# Patient Record
Sex: Female | Born: 1970
Health system: Southern US, Community
[De-identification: ages and names within clinical notes are randomized; demographics above are authoritative.]

## PROBLEM LIST (undated history)

## (undated) DIAGNOSIS — T7840XA Allergy, unspecified, initial encounter: Secondary | ICD-10-CM

## (undated) DIAGNOSIS — L659 Nonscarring hair loss, unspecified: Secondary | ICD-10-CM

## (undated) DIAGNOSIS — Z8041 Family history of malignant neoplasm of ovary: Secondary | ICD-10-CM

## (undated) DIAGNOSIS — N941 Unspecified dyspareunia: Secondary | ICD-10-CM

## (undated) DIAGNOSIS — K219 Gastro-esophageal reflux disease without esophagitis: Secondary | ICD-10-CM

## (undated) DIAGNOSIS — E559 Vitamin D deficiency, unspecified: Secondary | ICD-10-CM

## (undated) DIAGNOSIS — G63 Polyneuropathy in diseases classified elsewhere: Secondary | ICD-10-CM

## (undated) DIAGNOSIS — N200 Calculus of kidney: Secondary | ICD-10-CM

## (undated) DIAGNOSIS — R011 Cardiac murmur, unspecified: Secondary | ICD-10-CM

## (undated) DIAGNOSIS — F419 Anxiety disorder, unspecified: Secondary | ICD-10-CM

## (undated) DIAGNOSIS — Z87442 Personal history of urinary calculi: Secondary | ICD-10-CM

## (undated) DIAGNOSIS — J309 Allergic rhinitis, unspecified: Secondary | ICD-10-CM

## (undated) DIAGNOSIS — G5600 Carpal tunnel syndrome, unspecified upper limb: Secondary | ICD-10-CM

## (undated) DIAGNOSIS — K589 Irritable bowel syndrome without diarrhea: Secondary | ICD-10-CM

## (undated) DIAGNOSIS — F32A Depression, unspecified: Secondary | ICD-10-CM

## (undated) DIAGNOSIS — M1990 Unspecified osteoarthritis, unspecified site: Secondary | ICD-10-CM

## (undated) DIAGNOSIS — Z803 Family history of malignant neoplasm of breast: Secondary | ICD-10-CM

## (undated) DIAGNOSIS — E538 Deficiency of other specified B group vitamins: Secondary | ICD-10-CM

## (undated) DIAGNOSIS — F329 Major depressive disorder, single episode, unspecified: Secondary | ICD-10-CM

## (undated) DIAGNOSIS — G4730 Sleep apnea, unspecified: Secondary | ICD-10-CM

## (undated) DIAGNOSIS — L309 Dermatitis, unspecified: Secondary | ICD-10-CM

## (undated) HISTORY — DX: Vitamin D deficiency, unspecified: E55.9

## (undated) HISTORY — DX: Anxiety disorder, unspecified: F41.9

## (undated) HISTORY — DX: Allergy, unspecified, initial encounter: T78.40XA

## (undated) HISTORY — DX: Polyneuropathy in diseases classified elsewhere: E53.8

## (undated) HISTORY — DX: Irritable bowel syndrome, unspecified: K58.9

## (undated) HISTORY — DX: Unspecified dyspareunia: N94.10

## (undated) HISTORY — DX: Dermatitis, unspecified: L30.9

## (undated) HISTORY — DX: Calculus of kidney: N20.0

## (undated) HISTORY — DX: Family history of malignant neoplasm of ovary: Z80.41

## (undated) HISTORY — DX: Nonscarring hair loss, unspecified: L65.9

## (undated) HISTORY — DX: Major depressive disorder, single episode, unspecified: F32.9

## (undated) HISTORY — DX: Allergic rhinitis, unspecified: J30.9

## (undated) HISTORY — DX: Gastro-esophageal reflux disease without esophagitis: K21.9

## (undated) HISTORY — DX: Deficiency of other specified B group vitamins: G63

## (undated) HISTORY — DX: Unspecified osteoarthritis, unspecified site: M19.90

## (undated) HISTORY — DX: Carpal tunnel syndrome, unspecified upper limb: G56.00

## (undated) HISTORY — DX: Cardiac murmur, unspecified: R01.1

## (undated) HISTORY — DX: Depression, unspecified: F32.A

## (undated) HISTORY — DX: Family history of malignant neoplasm of breast: Z80.3

## (undated) SURGICAL SUPPLY — 2 items
GLOVE SURG SYN 6.5 ES PF (GLOVE) ×2 IMPLANT
GLOVE SURG SYN 8.5 E (GLOVE) ×6 IMPLANT

---

## 2005-11-24 HISTORY — PX: BARTHOLIN GLAND CYST EXCISION: SHX565

## 2006-10-24 HISTORY — PX: CHOLECYSTECTOMY: SHX55

## 2009-09-11 DIAGNOSIS — K589 Irritable bowel syndrome without diarrhea: Secondary | ICD-10-CM | POA: Insufficient documentation

## 2009-09-11 DIAGNOSIS — G4486 Cervicogenic headache: Secondary | ICD-10-CM | POA: Insufficient documentation

## 2009-09-11 DIAGNOSIS — J309 Allergic rhinitis, unspecified: Secondary | ICD-10-CM | POA: Insufficient documentation

## 2009-10-09 ENCOUNTER — Ambulatory Visit: Payer: Self-pay | Admitting: Family Medicine

## 2009-11-26 ENCOUNTER — Ambulatory Visit: Payer: Self-pay | Admitting: Unknown Physician Specialty

## 2009-12-06 ENCOUNTER — Encounter: Payer: Self-pay | Admitting: Maternal and Fetal Medicine

## 2010-06-27 ENCOUNTER — Observation Stay: Payer: Self-pay | Admitting: Obstetrics & Gynecology

## 2010-10-26 ENCOUNTER — Inpatient Hospital Stay: Payer: Self-pay | Admitting: Obstetrics and Gynecology

## 2010-10-27 DIAGNOSIS — O321XX0 Maternal care for breech presentation, not applicable or unspecified: Secondary | ICD-10-CM

## 2010-10-27 DIAGNOSIS — Z8759 Personal history of other complications of pregnancy, childbirth and the puerperium: Secondary | ICD-10-CM

## 2010-11-24 HISTORY — PX: COLONOSCOPY: SHX174

## 2011-05-02 ENCOUNTER — Ambulatory Visit: Payer: Self-pay | Admitting: Internal Medicine

## 2012-03-02 ENCOUNTER — Ambulatory Visit: Payer: Self-pay

## 2012-03-09 ENCOUNTER — Encounter: Payer: Self-pay | Admitting: Rheumatology

## 2012-03-24 ENCOUNTER — Encounter: Payer: Self-pay | Admitting: Rheumatology

## 2012-12-14 ENCOUNTER — Ambulatory Visit: Payer: Self-pay | Admitting: Otolaryngology

## 2014-03-01 DIAGNOSIS — F329 Major depressive disorder, single episode, unspecified: Secondary | ICD-10-CM | POA: Insufficient documentation

## 2014-03-01 DIAGNOSIS — M4690 Unspecified inflammatory spondylopathy, site unspecified: Secondary | ICD-10-CM | POA: Insufficient documentation

## 2014-03-01 DIAGNOSIS — M539 Dorsopathy, unspecified: Secondary | ICD-10-CM | POA: Insufficient documentation

## 2014-03-01 DIAGNOSIS — F32A Depression, unspecified: Secondary | ICD-10-CM | POA: Insufficient documentation

## 2015-09-11 ENCOUNTER — Ambulatory Visit (INDEPENDENT_AMBULATORY_CARE_PROVIDER_SITE_OTHER): Payer: BC Managed Care – PPO | Admitting: Family Medicine

## 2015-09-11 ENCOUNTER — Encounter: Payer: Self-pay | Admitting: Family Medicine

## 2015-09-11 VITALS — BP 126/86 | HR 114 | Temp 98.5°F | Resp 16 | Ht 66.0 in | Wt 190.8 lb

## 2015-09-11 DIAGNOSIS — F411 Generalized anxiety disorder: Secondary | ICD-10-CM | POA: Insufficient documentation

## 2015-09-11 DIAGNOSIS — L659 Nonscarring hair loss, unspecified: Secondary | ICD-10-CM | POA: Insufficient documentation

## 2015-09-11 DIAGNOSIS — L309 Dermatitis, unspecified: Secondary | ICD-10-CM | POA: Insufficient documentation

## 2015-09-11 DIAGNOSIS — G44219 Episodic tension-type headache, not intractable: Secondary | ICD-10-CM | POA: Diagnosis not present

## 2015-09-11 DIAGNOSIS — K219 Gastro-esophageal reflux disease without esophagitis: Secondary | ICD-10-CM | POA: Insufficient documentation

## 2015-09-11 DIAGNOSIS — F419 Anxiety disorder, unspecified: Secondary | ICD-10-CM | POA: Insufficient documentation

## 2015-09-11 DIAGNOSIS — G5600 Carpal tunnel syndrome, unspecified upper limb: Secondary | ICD-10-CM | POA: Insufficient documentation

## 2015-09-11 DIAGNOSIS — M069 Rheumatoid arthritis, unspecified: Secondary | ICD-10-CM | POA: Insufficient documentation

## 2015-09-11 DIAGNOSIS — E538 Deficiency of other specified B group vitamins: Secondary | ICD-10-CM | POA: Insufficient documentation

## 2015-09-11 DIAGNOSIS — F32A Depression, unspecified: Secondary | ICD-10-CM | POA: Insufficient documentation

## 2015-09-11 DIAGNOSIS — Z800 Family history of malignant neoplasm of digestive organs: Secondary | ICD-10-CM | POA: Insufficient documentation

## 2015-09-11 DIAGNOSIS — Z8349 Family history of other endocrine, nutritional and metabolic diseases: Secondary | ICD-10-CM | POA: Insufficient documentation

## 2015-09-11 DIAGNOSIS — G63 Polyneuropathy in diseases classified elsewhere: Secondary | ICD-10-CM

## 2015-09-11 DIAGNOSIS — E559 Vitamin D deficiency, unspecified: Secondary | ICD-10-CM | POA: Insufficient documentation

## 2015-09-11 DIAGNOSIS — F329 Major depressive disorder, single episode, unspecified: Secondary | ICD-10-CM | POA: Insufficient documentation

## 2015-09-11 MED ORDER — ISOMETHEPTENE-DICHLORAL-APAP 65-100-325 MG PO CAPS: 1.0000 | ORAL_CAPSULE | Freq: Four times a day (QID) | ORAL | Status: DC | PRN

## 2015-09-11 NOTE — Progress Notes (Addendum)
Subjective:     Patient ID: Carolyn Hays, female   DOB: [birth year], 44 y.o.   MRN: [id number]  HPI  Chief Complaint  Patient presents with  . Headache    Patient comes in office today to address chronic headache that has been intermittent since March. Patient reports over the last 6 weeks headache has stayed persistant on a daily basis, she reports taking otc Ibuprofen for pain. Patient was seen at [facility] Sunday 10/16 with complaints of headache, physician diagnosed patient with labyrinthitis and was prescribed Meclizine 25mg  .   States pressure headaches were persistent for months until August when they abated to intermittent in nature. Start at back of head then generalize. Has had wisdom teeth removed, chiropractic treatment, and updated eye exam over the past year but did not relieve her headaches.Gets relief with hot and cold compresses and has used Midrin in the past successfully.   Review of Systems  Genitourinary:       Followed by [facility] ob-gyn  Musculoskeletal:       Rheumatoid Arthritis followed by Dr.Klett at [facility].  Skin:       Sees dermatologist, [facility]. [facility].  Neurological:       Remote hx of migraine       Objective:   Physical Exam  Constitutional: She appears well-developed and well-nourished. No distress.  HENT:  Right Ear: Tympanic membrane normal.  Left Ear: Tympanic membrane normal.  Eyes: EOM are normal. Pupils are equal, round, and reactive to light.  Musculoskeletal:  Cervical ROM WNL  Neurological: Coordination (finger to nose WNL) normal.       Assessment:    1. Episodic tension-type headache, not intractable: defers Toradol. - isometheptene-acetaminophen-dichloralphenazone (MIDRIN) 65-100-325 MG capsule; Take 1 capsule by mouth 4 (four) times daily as needed for migraine. Maximum 5 capsules in 12 hours for migraine headaches, 8 capsules in 24 hours for tension headaches.  Dispense: 30 capsule; Refill: 0    Plan:   Consider trial off Caryn Section if rheumatologist agrees. Consider neurology consultation. Not to take Midrin and meclizine together.

## 2015-09-11 NOTE — Patient Instructions (Addendum)
Discuss trial off Harriette Ohara with your rheumatologist. Consider neurology consultation.

## 2015-09-12 ENCOUNTER — Other Ambulatory Visit: Payer: Self-pay | Admitting: Family Medicine

## 2015-09-12 NOTE — Telephone Encounter (Signed)
Called Rx in to [facility] who did not have rx in stock. I informed patient of this and called in prescription to [facility] [place] as requested. KW

## 2015-09-12 NOTE — Telephone Encounter (Signed)
Pt stated that an RX for  isometheptene-acetaminophen-dichloralphenazone (MIDRIN) 65-100-325 MG capsule was supposed to be sent to [facility] in [place] but they had not received the RX. Can this be faxed or called in? Thanks TNP

## 2015-09-13 ENCOUNTER — Telehealth: Payer: Self-pay | Admitting: Family Medicine

## 2015-09-13 NOTE — Telephone Encounter (Signed)
Called pharm again and prescription is available for pick up left message for patient that Rx is ready. KW

## 2015-09-13 NOTE — Telephone Encounter (Signed)
Pt stated that her husband tried to pick up the RX isometheptene-acetaminophen-dichloralphenazone (MIDRIN) 65-100-325 MG capsule from [facility] [place] but was told it hadn't been called in. Pt requested someone [facility] and call it in again or check to see what happened. Thanks TNP

## 2015-10-26 HISTORY — PX: OTHER SURGICAL HISTORY: SHX169

## 2016-02-19 ENCOUNTER — Encounter: Payer: Self-pay | Admitting: Emergency Medicine

## 2016-07-10 ENCOUNTER — Ambulatory Visit: Payer: BC Managed Care – PPO | Admitting: Family Medicine

## 2016-07-17 ENCOUNTER — Encounter: Payer: Self-pay | Admitting: Family Medicine

## 2016-07-17 ENCOUNTER — Telehealth: Payer: Self-pay | Admitting: Family Medicine

## 2016-07-17 ENCOUNTER — Ambulatory Visit (INDEPENDENT_AMBULATORY_CARE_PROVIDER_SITE_OTHER): Payer: BC Managed Care – PPO | Admitting: Family Medicine

## 2016-07-17 VITALS — BP 110/70 | HR 96 | Temp 98.2°F | Resp 16 | Wt 202.2 lb

## 2016-07-17 DIAGNOSIS — Z87442 Personal history of urinary calculi: Secondary | ICD-10-CM

## 2016-07-17 DIAGNOSIS — R1011 Right upper quadrant pain: Secondary | ICD-10-CM | POA: Diagnosis not present

## 2016-07-17 NOTE — Telephone Encounter (Signed)
What is a peer to peer? Is there another visit required?

## 2016-07-17 NOTE — Patient Instructions (Addendum)
Continue to push fluid intake. We will call you with the time of the scan.

## 2016-07-17 NOTE — Progress Notes (Signed)
Subjective:     Patient ID: Carolyn Hays, female   DOB: 05-07-1971, 45 y.o.   MRN: [id number]  HPI  Chief Complaint  Patient presents with  . Chest Pain    Patient comes in office today with concerns of rib pain on her right side for the past 6 months. Patient denies injury or heavy exertion, patient describes pains as a sharp ache.   States she has passed kidney stones on her right side in the past but usually will feel them moving to her bladder. Reports her pain is intermittent and can last for a few minutes to an entire day. States when painful feels as intense as kidney stones. No specific triggers and not pleuritic. No change in her bowel habits. Has had prior cholecystectomy and colonoscopy in 2013 (polypectomy x one). Not in pain today.   Review of Systems     Objective:   Physical Exam  Constitutional: She appears well-developed and well-nourished. No distress.  Abdominal: Soft. There is tenderness (mild in right upper quadrant). There is no guarding.       Assessment:    1. Right upper quadrant pain - CT RENAL STONE STUDY; Future  2. History of kidney stones - CT RENAL STONE STUDY; Future    Plan:    If scan normal, consider repeat colonoscopy.

## 2016-07-17 NOTE — Telephone Encounter (Signed)
Insurance company would like a peer to peer to get CT approved.Phone [phone].Member # [id number]

## 2016-07-18 ENCOUNTER — Other Ambulatory Visit: Payer: Self-pay | Admitting: Family Medicine

## 2016-07-18 DIAGNOSIS — R1011 Right upper quadrant pain: Secondary | ICD-10-CM

## 2016-07-18 NOTE — Telephone Encounter (Signed)
Nadine Counts needs to cqll # listed below to talk with MD

## 2016-07-18 NOTE — Telephone Encounter (Signed)
Insurance will not approve a CT scan but suggested renal ultrasound first which will be ordered.

## 2016-07-23 ENCOUNTER — Ambulatory Visit
Admission: RE | Admit: 2016-07-23 | Discharge: 2016-07-23 | Disposition: A | Discharge status: Home / Self Care | Payer: BC Managed Care – PPO | Source: Ambulatory Visit | Attending: Family Medicine | Admitting: Family Medicine

## 2016-07-23 DIAGNOSIS — R1011 Right upper quadrant pain: Secondary | ICD-10-CM | POA: Insufficient documentation

## 2016-07-24 ENCOUNTER — Other Ambulatory Visit: Payer: Self-pay | Admitting: Family Medicine

## 2016-07-24 ENCOUNTER — Telehealth: Payer: Self-pay

## 2016-07-24 DIAGNOSIS — R1011 Right upper quadrant pain: Secondary | ICD-10-CM

## 2016-07-24 NOTE — Telephone Encounter (Signed)
Patient advised as directed below. Patient wants referral.  Thanks,  -Joseline

## 2016-07-24 NOTE — Telephone Encounter (Signed)
Referral in progress. 

## 2016-07-24 NOTE — Telephone Encounter (Signed)
-----   Message from Anola Gurney, [place] sent at 07/24/2016  7:42 AM EDT ----- Ultrasound is normal without evidence of stones. Would suggest you see a gastroenterologist. Do you wish [place] to make the referral?

## 2016-07-24 NOTE — Telephone Encounter (Signed)
LMTCB  Thanks,  -Joseline 

## 2016-09-02 ENCOUNTER — Ambulatory Visit: Payer: BC Managed Care – PPO | Admitting: Gastroenterology

## 2016-10-09 ENCOUNTER — Telehealth: Payer: Self-pay

## 2016-10-09 NOTE — Telephone Encounter (Signed)
Dr Sullivan Lone saw the results and all thyroid levels were fine except T3 was down by 1 point per Dr Sullivan Lone just to follow and no referral is needed at this time. Advised patient and advised patient if she would feel better proceeding with referral in case that is fine and just to let us know. Patient Carolyn Hays

## 2016-10-09 NOTE — Telephone Encounter (Signed)
Patients states that her rheumatologist ordered lab work and informed her that her thyroid function was abnormal and informed her that she needed to follow up with a doctor. Patient wanted your opinion if she should follow up with you to address or would you recommend her see a endocrinologist? Patient states that if you do recommend her to see specialist which one would you recommend her see?

## 2016-10-09 NOTE — Telephone Encounter (Signed)
Have not seen lab to be able to comment. Can advise recommendation  after I see lab work.

## 2016-11-07 ENCOUNTER — Ambulatory Visit (INDEPENDENT_AMBULATORY_CARE_PROVIDER_SITE_OTHER): Payer: BC Managed Care – PPO | Admitting: Family Medicine

## 2016-11-07 ENCOUNTER — Encounter: Payer: Self-pay | Admitting: Family Medicine

## 2016-11-07 VITALS — BP 106/74 | HR 100 | Temp 98.4°F | Resp 16 | Wt 202.6 lb

## 2016-11-07 DIAGNOSIS — I809 Phlebitis and thrombophlebitis of unspecified site: Secondary | ICD-10-CM | POA: Diagnosis not present

## 2016-11-07 DIAGNOSIS — J40 Bronchitis, not specified as acute or chronic: Secondary | ICD-10-CM | POA: Diagnosis not present

## 2016-11-07 MED ORDER — AZITHROMYCIN 250 MG PO TABS
ORAL_TABLET | ORAL | 0 refills | Status: DC
Start: 2016-11-07 — End: 2017-05-19

## 2016-11-07 NOTE — Progress Notes (Signed)
Subjective:     Patient ID: Carolyn Hays, female   DOB: 02-09-1971, 45 y.o.   MRN: [id number]  HPI  Chief Complaint  Patient presents with  . Cough    Patient comes in office today with symptoms of cough and congestion for the past 5 weeks, patient reports that she has taken otc cough drops.   . Leg Pain    Patient reports of calf pain on her right leg for the past 2 days. Patient describes pain as stinging and throbbing, she denies any swelling or injury.   States cough is non-productive. No sinus congestion. Reports both son and husband are ill as well. Has been taking cough drops with mild improvement. Remains on [place].   Review of Systems     Objective:   Physical Exam  Constitutional: She appears well-developed and well-nourished. No distress.  Cardiovascular:  Pulses:      Dorsalis pedis pulses are 2+ on the right side.       Posterior tibial pulses are 2+ on the right side.  Skin:  Right lateral calf with prominent varicose vein which is tender to the touch. No overlying erythema. No distal edema.  Ears: T.M's intact without inflammation Sinuses: non-tender Throat: mild tonsillar enlargement without erythema or exudate Neck: no cervical adenopathy Lungs: clear     Assessment:    1. Bronchitis - azithromycin (ZITHROMAX Z-PAK) 250 MG tablet; 2 pills the first day then one pill daily  Dispense: 6 each; Refill: 0  2. Phlebitis    Plan:    Discussed use of warm compresses and ASA. May use Delsym for cough.

## 2016-11-07 NOTE — Patient Instructions (Signed)
Discussed use of Delsym for cough. Warm compresses to leg and use of aspirin 3-4 x day.

## 2016-12-17 ENCOUNTER — Telehealth: Payer: Self-pay | Admitting: Family Medicine

## 2016-12-17 MED ORDER — OSELTAMIVIR PHOSPHATE 75 MG PO CAPS: 75.0000 mg | ORAL_CAPSULE | Freq: Every day | ORAL | 0 refills | Status: AC

## 2016-12-17 NOTE — Telephone Encounter (Signed)
Tamiflu 75 mg daily times 7.

## 2016-12-17 NOTE — Telephone Encounter (Signed)
Please advise 

## 2016-12-17 NOTE — Telephone Encounter (Signed)
Pt states her son tested positive for the flu yesterday.  Pt states she does has some sinus congestion but no flu symptoms at this time.  Pt is asking if she should go ahead and get a Rx to prevent getting the flu.  Pt states she did have a flu shot at work this year.  [facility] [place].  BO#[phone]/DP

## 2016-12-17 NOTE — Telephone Encounter (Signed)
Medication sent into patient's pharmacy. L/M saying its ready.

## 2016-12-23 LAB — CBC AND DIFFERENTIAL
HEMATOCRIT: 35 — AB (ref 36–46)
Hemoglobin: 11.6 — AB (ref 12.0–16.0)
NEUTROS ABS: 3
PLATELETS: 319 (ref 150–399)
WBC: 6.1

## 2016-12-23 LAB — LIPID PANEL
Cholesterol: 285 — AB (ref 0–200)
HDL: 91 — AB (ref 35–70)
LDL Cholesterol: 180
LDl/HDL Ratio: 3.1
Triglycerides: 71 (ref 40–160)

## 2016-12-23 LAB — VITAMIN B12: Vitamin B-12: 555

## 2016-12-23 LAB — HEPATIC FUNCTION PANEL
ALT: 14 (ref 7–35)
AST: 17 (ref 13–35)
Alkaline Phosphatase: 95 (ref 25–125)
BILIRUBIN, TOTAL: 0.5

## 2016-12-23 LAB — BASIC METABOLIC PANEL
BUN: 10 (ref 4–21)
CREATININE: 0.7 (ref 0.5–1.1)
GLUCOSE: 94
POTASSIUM: 4.1 (ref 3.4–5.3)
SODIUM: 141 (ref 137–147)

## 2016-12-23 LAB — TSH: TSH: 0.87 (ref 0.41–5.90)

## 2017-05-19 ENCOUNTER — Ambulatory Visit (INDEPENDENT_AMBULATORY_CARE_PROVIDER_SITE_OTHER): Payer: BC Managed Care – PPO | Admitting: Family Medicine

## 2017-05-19 VITALS — BP 134/88 | HR 96 | Temp 99.4°F | Resp 14 | Wt 208.0 lb

## 2017-05-19 DIAGNOSIS — E784 Other hyperlipidemia: Secondary | ICD-10-CM | POA: Diagnosis not present

## 2017-05-19 DIAGNOSIS — G4489 Other headache syndrome: Secondary | ICD-10-CM

## 2017-05-19 DIAGNOSIS — M069 Rheumatoid arthritis, unspecified: Secondary | ICD-10-CM

## 2017-05-19 DIAGNOSIS — Z6833 Body mass index (BMI) 33.0-33.9, adult: Secondary | ICD-10-CM

## 2017-05-19 DIAGNOSIS — F411 Generalized anxiety disorder: Secondary | ICD-10-CM

## 2017-05-19 DIAGNOSIS — K219 Gastro-esophageal reflux disease without esophagitis: Secondary | ICD-10-CM

## 2017-05-19 DIAGNOSIS — E7849 Other hyperlipidemia: Secondary | ICD-10-CM

## 2017-05-19 MED ORDER — BUPROPION HCL ER (XL) 150 MG PO TB24: 150.0000 mg | ORAL_TABLET | Freq: Every day | ORAL | 12 refills | Status: DC

## 2017-05-19 MED ORDER — RANITIDINE HCL 150 MG PO TABS: 150.0000 mg | ORAL_TABLET | Freq: Two times a day (BID) | ORAL | 5 refills | Status: DC

## 2017-05-19 NOTE — Progress Notes (Signed)
Carolyn Hays  MRN: [id number] DOB: January 29, 1971  Subjective:  HPI  Patient would like to discuss a few issues. Headache: patient states she has had headaches always. Last office note for headache that is on file was on 09/11/15 and patient saw Anola Gurney at that time. That headache patient states got better but then re started in April 2018-this year, and she is hurting from shoulders and up. Head, neck, ear, eyes. Patient has seen ophthalmologist to see if headaches are coming from this but that exam was ok. B/P: patient states she has noticed her b/p has been getting elevated and notices this when she is at work or stressed and her bottom number would be over 100. By the time she gets home b/p readings are around 110s/70s. She has had some dizziness last week. She does get some chest tightness and this happens when she gets overwhelmed and stressed. Depression screen [facility] 2/9 05/19/2017  Decreased Interest 0  Down, Depressed, Hopeless 1  PHQ - 2 Score 1  Altered sleeping 1  Tired, decreased energy 1  Change in appetite 3  Feeling bad or failure about yourself  3  Trouble concentrating 0  Moving slowly or fidgety/restless 0  Suicidal thoughts 0  PHQ-9 Score 9   Lab Results  Component Value Date   CHOL 285 (A) 12/23/2016   HDL 91 (A) 12/23/2016   LDLCALC 180 12/23/2016   TRIG 71 12/23/2016   Patient has been seen a counselor just started. She is taking Cymbalta and is taking 30 mg every other day, trying to wean off the medication. Patient sees rheumatologist and has been [place] and she is weaning off the medication due to possible side effects from this is headache and hyperlipidemia which she now has per recent lab work. Patient would like to discuss her lipids and what options she has. Patient currently sees counselor, dietician and rheumatologist. She is bothered by weight gain also and has history of thyroid issue with her mother. Wt Readings from Last 3 Encounters:    05/19/17 208 lb (94.3 kg)  11/07/16 202 lb 9.6 oz (91.9 kg)  07/17/16 202 lb 3.2 oz (91.7 kg)   BP Readings from Last 3 Encounters:  05/19/17 134/88  11/07/16 106/74  07/17/16 110/70    Patient Active Problem List   Diagnosis Date Noted  . Alopecia 09/11/2015  . Carpal tunnel syndrome 09/11/2015  . Rheumatoid arthritis (HCC) 09/11/2015  . Dermatitis, eczematoid 09/11/2015  . Acid reflux 09/11/2015  . Anxiety, generalized 09/11/2015  . 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 Avitaminosis D 09/11/2015  . Disorder of joint of spine (HCC) 03/01/2014  . Allergic rhinitis 09/11/2009  . Cephalalgia 09/11/2009  . Adaptive colitis 09/11/2009    Past Medical History:  Diagnosis Date  . Allergic rhinitis   . Alopecia   . Anxiety   . Arthritis   . Carpal tunnel syndrome   . Depression   . Eczema   . GERD (gastroesophageal reflux disease)   . IBS (irritable bowel syndrome)   . Vitamin B12 deficiency neuropathy (HCC)   . Vitamin D deficiency     Social History   Social History  . Marital status: Married    Spouse name: N/A  . Number of children: N/A  . Years of education: N/A   Occupational History  . Not on file.   Social History Main Topics  . Smoking status: Never Smoker  . Smokeless tobacco: Not on file  . Alcohol use 1.8  oz/week    3 Glasses of wine per week  . Drug use: No  . Sexual activity: Not on file   Other Topics Concern  . Not on file   Social History Narrative  . No narrative on file    Outpatient Encounter Prescriptions as of 05/19/2017  Medication Sig Note  . cetirizine (ZYRTEC) 10 MG tablet Take 10 mg by mouth. 09/11/2015: Received from: [facility]  . Cholecalciferol (VITAMIN D3) 2000 UNITS capsule Take by mouth. 09/11/2015: Received from: [facility]  . DULoxetine (CYMBALTA) 30 MG capsule taking 1 every other day 11/07/2016: Patient reports that she takes 60mg  qd  . Flaxseed, Linseed, (FLAX SEED OIL PO) Take 1,400 mg by mouth daily.    . Omega-3 Fatty Acids (FISH OIL PO) Take 2,000 mg by mouth 2 (two) times daily.   5 MG TABS  09/11/2015: Received from: External Pharmacy  . [DISCONTINUED] azithromycin (ZITHROMAX Z-PAK) 250 MG tablet 2 pills the first day then one pill daily    No facility-administered encounter medications on file as of 05/19/2017.     Allergies  Allergen Reactions  . Indomethacin     severe H/As    Review of Systems  Constitutional: Negative for diaphoresis, fever and malaise/fatigue.       Weight gain  HENT: Positive for ear pain.   Eyes: Negative.   Respiratory: Negative.   Cardiovascular: Positive for chest pain. Negative for palpitations.  Gastrointestinal: Negative.   Musculoskeletal: Positive for back pain, joint pain, myalgias and neck pain.  Skin: Negative.   Neurological: Positive for dizziness and headaches. Negative for tingling and tremors.  Endo/Heme/Allergies: Negative.   Psychiatric/Behavioral: Positive for depression. The patient has insomnia.     Objective:  BP 134/88   Pulse 96   Temp 99.4 F (37.4 C)   Resp 14   Wt 208 lb (94.3 kg)   LMP 09/07/2015   BMI 33.57 kg/m   Physical Exam  Constitutional: She is oriented to person, place, and time and well-developed, well-nourished, and in no distress.  HENT:  Head: Normocephalic and atraumatic.  Right Ear: External ear normal.  Left Ear: External ear normal.  Nose: Nose normal.  Mouth/Throat: Oropharynx is clear and moist.  Rt tonsil--1+.  Lt tonsil---0.  Cardiovascular: Normal rate, regular rhythm, normal heart sounds and intact distal pulses.   Pulmonary/Chest: Effort normal and breath sounds normal.  Abdominal: Soft.  Neurological: She is alert and oriented to person, place, and time. GCS score is 15.  Skin: Skin is warm and dry.  Psychiatric: Mood, memory, affect and judgment normal.    Assessment and Plan :  1. Other hyperlipidemia Will follow and re check on the next visit. Consider Crestor due  to FH.  2. BMI 33.0-33.9,adult Work on habits and follow up next office visit.  3. Rheumatoid arthritis involving multiple sites, unspecified rheumatoid factor presence (HCC) Follows rheaumatologist.  4. Anxiety, generalized Wean off Cymbalta and try Wellbutrin.GAD major issue for this pt.More than 50% of 25 minute visit in counselling  5. Other headache syndrome  6. Gastroesophageal reflux disease, esophagitis presence not specified Start Ranitidine.Pt took omeprazole. 7.FH of CAD with father withMI in 6s and brother with CAD in 23s.  HPI, Exam and A&P transcribed by 56s, RMA under direction and in the presence of Samara Deist, MD. I have done the exam and reviewed the chart and it is accurate to the best of my knowledge. Julieanne Manson has been used  and  any errors in dictation or transcription are unintentional. Julieanne Manson M.D. [facility]

## 2017-05-28 ENCOUNTER — Encounter: Payer: Self-pay | Admitting: Family Medicine

## 2017-06-08 ENCOUNTER — Encounter: Payer: Self-pay | Admitting: Family Medicine

## 2017-06-08 ENCOUNTER — Ambulatory Visit (INDEPENDENT_AMBULATORY_CARE_PROVIDER_SITE_OTHER): Payer: BC Managed Care – PPO | Admitting: Family Medicine

## 2017-06-08 ENCOUNTER — Ambulatory Visit
Admission: RE | Admit: 2017-06-08 | Discharge: 2017-06-08 | Disposition: A | Discharge status: Home / Self Care | Payer: BC Managed Care – PPO | Source: Ambulatory Visit | Attending: Family Medicine | Admitting: Family Medicine

## 2017-06-08 VITALS — BP 122/84 | HR 122 | Temp 97.2°F | Resp 16 | Wt 208.0 lb

## 2017-06-08 DIAGNOSIS — T148XXA Other injury of unspecified body region, initial encounter: Secondary | ICD-10-CM | POA: Diagnosis not present

## 2017-06-08 DIAGNOSIS — M79604 Pain in right leg: Secondary | ICD-10-CM

## 2017-06-08 DIAGNOSIS — M899 Disorder of bone, unspecified: Secondary | ICD-10-CM | POA: Insufficient documentation

## 2017-06-08 DIAGNOSIS — Z23 Encounter for immunization: Secondary | ICD-10-CM

## 2017-06-08 NOTE — Progress Notes (Signed)
Patient: Carolyn Hays Female    DOB: 1971/07/20   46 y.o.   MRN: [id number] Visit Date: 06/08/2017  Today's Provider: Megan Mans, MD   Chief Complaint  Patient presents with  . Leg Pain   Subjective:    Leg Pain   The incident occurred more than 1 week ago (x 9 days). The injury mechanism was a fall (slipped on a wet crosstie. She landed on gravel, and the leg landed on the crosstie.). The pain is present in the right leg. The quality of the pain is described as aching, burning and stabbing. The pain is at a severity of 3/10. Pain course: slowly improving. Pertinent negatives include no inability to bear weight, loss of motion, loss of sensation, muscle weakness, numbness or tingling. Associated symptoms comments: Swelling, bruising, redness and some oozing. Denies warmth of area.. Exacerbated by: standing on leg several hours. She has tried ice, heat and NSAIDs for the symptoms. The treatment provided moderate relief.       Allergies  Allergen Reactions  . Indomethacin     severe H/As     Current Outpatient Prescriptions:  .  buPROPion (WELLBUTRIN XL) 150 MG 24 hr tablet, Take 1 tablet (150 mg total) by mouth daily., Disp: 30 tablet, Rfl: 12 .  cetirizine (ZYRTEC) 10 MG tablet, Take 10 mg by mouth., Disp: , Rfl:  .  Cholecalciferol (VITAMIN D3) 2000 UNITS capsule, Take by mouth., Disp: , Rfl:  .  Flaxseed, Linseed, (FLAX SEED OIL PO), Take 1,400 mg by mouth daily., Disp: , Rfl:  .  Omega-3 Fatty Acids (FISH OIL PO), Take 2,000 mg by mouth 2 (two) times daily., Disp: , Rfl:  .  ranitidine (ZANTAC) 150 MG tablet, Take 1 tablet (150 mg total) by mouth 2 (two) times daily., Disp: 60 tablet, Rfl: 5  Review of Systems  Constitutional: Negative for activity change, appetite change, chills, diaphoresis, fatigue, fever and unexpected weight change.  Respiratory: Negative for shortness of breath.   Cardiovascular: Positive for palpitations. Negative for chest pain and  leg swelling.  Neurological: Negative for tingling, syncope and numbness.    Social History  Substance Use Topics  . Smoking status: Never Smoker  . Smokeless tobacco: Never Used  . Alcohol use 1.8 oz/week    3 Glasses of wine per week   Objective:   BP 122/84 (BP Location: Left Arm, Patient Position: Sitting, Cuff Size: Large)   Pulse (!) 122   Temp (!) 97.2 F (36.2 C) (Oral)   Resp 16   Wt 208 lb (94.3 kg)   LMP 09/07/2015   SpO2 99%   BMI 33.57 kg/m  Vitals:   06/08/17 1414  BP: 122/84  Pulse: (!) 122  Resp: 16  Temp: (!) 97.2 F (36.2 C)  TempSrc: Oral  SpO2: 99%  Weight: 208 lb (94.3 kg)     Physical Exam  Constitutional: She is oriented to person, place, and time. She appears well-developed and well-nourished.  Cardiovascular: Regular rhythm and normal heart sounds.   Pulmonary/Chest: Effort normal and breath sounds normal. No respiratory distress.  Musculoskeletal: She exhibits edema (and bruising around lesion of RLL) and tenderness.  Neurovascular exam intact.  Neurological: She is alert and oriented to person, place, and time.  Psychiatric: She has a normal mood and affect. Her behavior is normal.        Assessment & Plan:     1. Right leg pain Most likely bone bruise. Advised  pt to rest leg as much as possible. Continue IBU as needed. - DG Tibia/Fibula Right  2. Abrasion Tdap given. - Tdap vaccine greater than or equal to 7yo IM      I have done the exam and reviewed the above chart and it is accurate to the best of my knowledge. Dentist has been used in this note in any air is in the dictation or transcription are unintentional.  Megan Mans, MD  [facility]

## 2017-06-10 ENCOUNTER — Ambulatory Visit: Payer: BC Managed Care – PPO | Admitting: Family Medicine

## 2017-06-16 LAB — VITAMIN B12: Vitamin B-12: 485

## 2017-06-16 LAB — BASIC METABOLIC PANEL WITH GFR
BUN: 8 (ref 4–21)
Creatinine: 0.7 (ref 0.5–1.1)
Glucose: 102
Potassium: 4.5 (ref 3.4–5.3)
Sodium: 142 (ref 137–147)

## 2017-06-16 LAB — CBC AND DIFFERENTIAL
HCT: 35 — AB (ref 36–46)
Hemoglobin: 11.5 — AB (ref 12.0–16.0)
Neutrophils Absolute: 4
Platelets: 342 (ref 150–399)
WBC: 6.2

## 2017-06-16 LAB — HEPATIC FUNCTION PANEL
ALT: 15 (ref 7–35)
AST: 17 (ref 13–35)
Alkaline Phosphatase: 120 (ref 25–125)
Bilirubin, Total: 0.4

## 2017-06-16 LAB — VITAMIN D 25 HYDROXY (VIT D DEFICIENCY, FRACTURES): Vit D, 25-Hydroxy: 38.8

## 2017-06-16 LAB — POCT ERYTHROCYTE SEDIMENTATION RATE, NON-AUTOMATED: Sed Rate: 23

## 2017-06-16 LAB — LIPID PANEL
CHOLESTEROL: 213 — AB (ref 0–200)
HDL: 59 (ref 35–70)
LDL Cholesterol: 128
TRIGLYCERIDES: 129 (ref 40–160)

## 2017-07-13 ENCOUNTER — Ambulatory Visit: Payer: BC Managed Care – PPO | Admitting: Family Medicine

## 2017-07-22 ENCOUNTER — Encounter: Payer: Self-pay | Admitting: Family Medicine

## 2017-07-22 ENCOUNTER — Ambulatory Visit (INDEPENDENT_AMBULATORY_CARE_PROVIDER_SITE_OTHER): Payer: BC Managed Care – PPO | Admitting: Family Medicine

## 2017-07-22 VITALS — BP 140/84 | HR 102 | Temp 99.3°F | Resp 16 | Wt 213.4 lb

## 2017-07-22 DIAGNOSIS — K219 Gastro-esophageal reflux disease without esophagitis: Secondary | ICD-10-CM | POA: Diagnosis not present

## 2017-07-22 DIAGNOSIS — E7849 Other hyperlipidemia: Secondary | ICD-10-CM

## 2017-07-22 DIAGNOSIS — F411 Generalized anxiety disorder: Secondary | ICD-10-CM | POA: Diagnosis not present

## 2017-07-22 DIAGNOSIS — Z23 Encounter for immunization: Secondary | ICD-10-CM

## 2017-07-22 DIAGNOSIS — E784 Other hyperlipidemia: Secondary | ICD-10-CM

## 2017-07-22 DIAGNOSIS — Z6834 Body mass index (BMI) 34.0-34.9, adult: Secondary | ICD-10-CM

## 2017-07-22 DIAGNOSIS — M069 Rheumatoid arthritis, unspecified: Secondary | ICD-10-CM | POA: Diagnosis not present

## 2017-07-22 MED ORDER — BUPROPION HCL ER (XL) 300 MG PO TB24: 300.0000 mg | ORAL_TABLET | Freq: Every day | ORAL | 12 refills | Status: DC

## 2017-07-22 NOTE — Progress Notes (Signed)
Carolyn Hays  MRN: [id number] DOB: [birth year]  Subjective:  HPI  Patient is here to follow up from office visit on 05/19/17. Patient provided copy of recent lab work done by the rhaumatologist. On this visit wanted to discuss lipid and possibility of starting medication. Discussed with patient to wean off Cymbalta and to try Wellbutrin.  Patient is doing the same she feels like since switching to Wellbutrin. She is seen a counselor and she is helping patient with stress management.  Patient has re started Methotrexate due to developing severe joint pain been off the medication. Depression screen [facility] 2/9 07/22/2017 05/19/2017  Decreased Interest 0 0  Down, Depressed, Hopeless 3 1  PHQ - 2 Score 3 1  Altered sleeping 2 1  Tired, decreased energy 2 1  Change in appetite 3 3  Feeling bad or failure about yourself  0 3  Trouble concentrating 0 0  Moving slowly or fidgety/restless 0 0  Suicidal thoughts 0 0  PHQ-9 Score 10 9  Difficult doing work/chores Extremely dIfficult -   GERD: patient was advised to try Ranitidine. Patient states symptoms are much better.  Patient Active Problem List   Diagnosis Date Noted  . Alopecia 09/11/2015  . Carpal tunnel syndrome 09/11/2015  . Rheumatoid arthritis (HCC) 09/11/2015  . Dermatitis, eczematoid 09/11/2015  . Acid reflux 09/11/2015  . Anxiety, generalized 09/11/2015  . 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 Avitaminosis D 09/11/2015  . Disorder of joint of spine (HCC) 03/01/2014  . Allergic rhinitis 09/11/2009  . Cephalalgia 09/11/2009  . Adaptive colitis 09/11/2009    Past Medical History:  Diagnosis Date  . Allergic rhinitis   . Alopecia   . Anxiety   . Arthritis   . Carpal tunnel syndrome   . Depression   . Eczema   . GERD (gastroesophageal reflux disease)   . IBS (irritable bowel syndrome)   . Vitamin B12 deficiency neuropathy (HCC)   . Vitamin D deficiency     Social History   Social History  . Marital status:  Married    Spouse name: N/A  . Number of children: N/A  . Years of education: N/A   Occupational History  . Not on file.   Social History Main Topics  . Smoking status: Never Smoker  . Smokeless tobacco: Never Used  . Alcohol use 1.8 oz/week    3 Glasses of wine per week  . Drug use: No  . Sexual activity: Not on file   Other Topics Concern  . Not on file   Social History Narrative  . No narrative on file    Outpatient Encounter Prescriptions as of 07/22/2017  Medication Sig Note  . buPROPion (WELLBUTRIN XL) 150 MG 24 hr tablet Take 1 tablet (150 mg total) by mouth daily.   . cetirizine (ZYRTEC) 10 MG tablet Take 10 mg by mouth. 09/11/2015: Received from: [facility]  . Cholecalciferol (VITAMIN D3) 2000 UNITS capsule Take by mouth. 09/11/2015: Received from: [facility]  . Flaxseed, Linseed, (FLAX SEED OIL PO) Take 1,400 mg by mouth daily.   . folic acid (FOLVITE) 1 MG tablet folic acid 1 mg tablet  Take 1 tablet every day by oral route.   . methotrexate (RHEUMATREX) 2.5 MG tablet 4 tablets once a week   . Omega-3 Fatty Acids (FISH OIL PO) Take 2,000 mg by mouth 2 (two) times daily.   . ranitidine (ZANTAC) 150 MG tablet Take 1 tablet (150 mg total) by mouth 2 (  two) times daily.    No facility-administered encounter medications on file as of 07/22/2017.     Allergies  Allergen Reactions  . Indomethacin     severe H/As    Review of Systems  Constitutional: Positive for malaise/fatigue.  Eyes: Negative.   Respiratory: Negative.   Cardiovascular: Negative.   Gastrointestinal: Negative.        Better on medication  Musculoskeletal: Positive for back pain, joint pain, myalgias and neck pain.  Neurological: Negative.   Psychiatric/Behavioral: Positive for depression. The patient is nervous/anxious and has insomnia.     Objective:  BP 140/84   Pulse (!) 102   Temp 99.3 F (37.4 C)   Resp 16   Wt 213 lb 6.4 oz (96.8 kg)   LMP 09/07/2015   BMI 34.44 kg/m    Physical Exam  Constitutional: She is oriented to person, place, and time and well-developed, well-nourished, and in no distress.  HENT:  Head: Normocephalic and atraumatic.  Right Ear: External ear normal.  Left Ear: External ear normal.  Nose: Nose normal.  Eyes: Pupils are equal, round, and reactive to light. Conjunctivae are normal.  Cardiovascular: Normal rate, regular rhythm, normal heart sounds and intact distal pulses.  Exam reveals no gallop.   No murmur heard. Pulmonary/Chest: Effort normal and breath sounds normal. No respiratory distress. She has no wheezes.  Abdominal: Soft.  Musculoskeletal: She exhibits no edema or tenderness.  Neurological: She is alert and oriented to person, place, and time. Gait normal. GCS score is 15.  Skin: Skin is warm and dry.  Psychiatric: Mood, memory, affect and judgment normal.   Assessment and Plan :  1. Other hyperlipidemia Level is better since been off Xeljanz. Elevated levels may have been do to the medication. Will follow at this time without medication treatment.  2. Rheumatoid arthritis involving multiple sites, unspecified rheumatoid factor presence (HCC) Patient is back on Methotrexate and is doing better.  3. Anxiety, generalized Encouraged patient to continue seen counselor. Will go ahead and increase Wellbutrin to 300 mg daily. Follow up in 4 months.  4. Gastroesophageal reflux disease, esophagitis presence not specified Better on Zantac. Continue medication.  5. BMI 34 Patient advised to work on habits and exercise as she can.  HPI, Exam and A&P transcribed by Domingo Cocking, RMA under direction and in the presence of Julieanne Manson, MD. I have done the exam and reviewed the chart and it is accurate to the best of my knowledge. Dentist has been used and  any errors in dictation or transcription are unintentional. Julieanne Manson M.D. [facility]

## 2017-07-31 ENCOUNTER — Encounter: Payer: Self-pay | Admitting: Family Medicine

## 2017-07-31 ENCOUNTER — Ambulatory Visit (INDEPENDENT_AMBULATORY_CARE_PROVIDER_SITE_OTHER): Payer: BC Managed Care – PPO | Admitting: Family Medicine

## 2017-07-31 VITALS — BP 114/88 | HR 101 | Temp 98.4°F | Resp 16 | Wt 210.2 lb

## 2017-07-31 DIAGNOSIS — R1011 Right upper quadrant pain: Secondary | ICD-10-CM | POA: Diagnosis not present

## 2017-07-31 LAB — POCT URINALYSIS DIPSTICK
BILIRUBIN UA: NEGATIVE
Blood, UA: NEGATIVE
Glucose, UA: NEGATIVE
KETONES UA: NEGATIVE
Leukocytes, UA: NEGATIVE
Nitrite, UA: NEGATIVE
Urobilinogen, UA: 0.2 E.U./dL
pH, UA: 8.5 — AB (ref 5.0–8.0)

## 2017-07-31 MED ORDER — DICYCLOMINE HCL 20 MG PO TABS: 20.0000 mg | ORAL_TABLET | Freq: Four times a day (QID) | ORAL | 0 refills | Status: DC

## 2017-07-31 NOTE — Patient Instructions (Signed)
Let us know if not improving or new symptoms. 

## 2017-07-31 NOTE — Progress Notes (Signed)
Subjective:     Patient ID: Carolyn Hays, female   DOB: 19-Jul-1971, 46 y.o.   MRN: [id number]  HPI  Chief Complaint  Patient presents with  . Abdominal Pain    Patient comes in office today with complaints of RUQ pain radiating to back, and what she believes is a possible "knot" under her skin next to her belly button. Patient reports symptoms have been present for the past 3 days, she describes pain as a ache and states that skin is tender to the touch on side of abdomen. Associated symptoms also include constipation and diarrhea.   Has hx of kidney stones, chronic RUQ pain (s/p cholecystectomy), IBS-D (in remission),  colonoscopy in 2013 with polypectomy x one,and normal renal U/S 07/23/16. States that 9/2 she developed 3-4 episodes of pasty, loose stools.Subsequently her right upper abdomen and right flank have become sore to the touch. No dysuria, fever or chills. States she was having formed stools daily prior to the onset of her sx. 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 She is contemplating getting out of the public schools.   Review of Systems     Objective:   Physical Exam  Constitutional: She appears well-developed and well-nourished. No distress.  Abdominal: There is tenderness ( RUQ and flank area/ no rash noted). Guarding: RUQ.       Assessment:    1. Right upper quadrant abdominal pain - POCT urinalysis dipstick - dicyclomine (BENTYL) 20 MG tablet; Take 1 tablet (20 mg total) by mouth every 6 (six) hours. As needed for diarrhea or abdominal pain  Dispense: 28 tablet; Refill: 0    Plan:    Consider further imaging if not improving.

## 2017-08-03 ENCOUNTER — Other Ambulatory Visit: Payer: Self-pay | Admitting: Family Medicine

## 2017-08-03 ENCOUNTER — Telehealth: Payer: Self-pay | Admitting: Family Medicine

## 2017-08-03 DIAGNOSIS — R1011 Right upper quadrant pain: Secondary | ICD-10-CM

## 2017-08-03 NOTE — Telephone Encounter (Signed)
Pt called saying she is still having pain but it is different and has moved.  Frequent feeling of urination but nothing coming out.  Fells more like a kidney stone.  pease advise [phone]  Thanks teri

## 2017-08-03 NOTE — Telephone Encounter (Signed)
Let her know we are scheduling another renal ultrasound

## 2017-08-03 NOTE — Telephone Encounter (Signed)
Left message advising pt.  (Okay per DPR).  Advised her to call back if she has any questions.   Thanks,   -Vernona Rieger

## 2017-08-05 ENCOUNTER — Ambulatory Visit: Payer: BC Managed Care – PPO

## 2017-08-07 ENCOUNTER — Ambulatory Visit
Admission: RE | Admit: 2017-08-07 | Discharge: 2017-08-07 | Disposition: A | Discharge status: Home / Self Care | Payer: BC Managed Care – PPO | Source: Ambulatory Visit | Attending: Family Medicine | Admitting: Family Medicine

## 2017-08-07 DIAGNOSIS — R1011 Right upper quadrant pain: Secondary | ICD-10-CM | POA: Insufficient documentation

## 2017-08-07 DIAGNOSIS — Z87442 Personal history of urinary calculi: Secondary | ICD-10-CM | POA: Insufficient documentation

## 2017-08-07 DIAGNOSIS — R3915 Urgency of urination: Secondary | ICD-10-CM | POA: Diagnosis not present

## 2017-08-10 ENCOUNTER — Ambulatory Visit: Payer: BC Managed Care – PPO

## 2017-08-12 ENCOUNTER — Telehealth: Payer: Self-pay

## 2017-08-12 NOTE — Telephone Encounter (Signed)
-----   Message from Anola Gurney, [place] sent at 08/10/2017  7:56 AM EDT ----- Ultrasound is ok without abnormality. If pain persists would also get an opinion from Dr. Sullivan Lone.

## 2017-08-12 NOTE — Telephone Encounter (Signed)
lmtcb

## 2017-08-12 NOTE — Telephone Encounter (Signed)
Patient reports she is still having some pain. Patient reports scheduling an appointment with GYN 08/21/17. sd

## 2017-08-21 ENCOUNTER — Encounter: Payer: Self-pay | Admitting: Certified Nurse Midwife

## 2017-08-21 ENCOUNTER — Ambulatory Visit (INDEPENDENT_AMBULATORY_CARE_PROVIDER_SITE_OTHER): Payer: BC Managed Care – PPO | Admitting: Certified Nurse Midwife

## 2017-08-21 VITALS — BP 128/82 | HR 107 | Ht 66.0 in | Wt 209.0 lb

## 2017-08-21 DIAGNOSIS — R6882 Decreased libido: Secondary | ICD-10-CM

## 2017-08-21 DIAGNOSIS — Z1231 Encounter for screening mammogram for malignant neoplasm of breast: Secondary | ICD-10-CM

## 2017-08-21 DIAGNOSIS — Z124 Encounter for screening for malignant neoplasm of cervix: Secondary | ICD-10-CM

## 2017-08-21 DIAGNOSIS — Z1211 Encounter for screening for malignant neoplasm of colon: Secondary | ICD-10-CM

## 2017-08-21 DIAGNOSIS — Z01419 Encounter for gynecological examination (general) (routine) without abnormal findings: Secondary | ICD-10-CM

## 2017-08-21 DIAGNOSIS — M5489 Other dorsalgia: Secondary | ICD-10-CM | POA: Diagnosis not present

## 2017-08-21 DIAGNOSIS — R1031 Right lower quadrant pain: Secondary | ICD-10-CM

## 2017-08-21 DIAGNOSIS — Z1239 Encounter for other screening for malignant neoplasm of breast: Secondary | ICD-10-CM

## 2017-08-21 NOTE — Progress Notes (Signed)
Gynecology Annual Exam  PCP: Jerrol Banana., MD  Chief Complaint:  Chief Complaint  Patient presents with  . Gynecologic Exam    History of Present Illness:Carolyn Hays is a 46 year old Caucasian/White female, G1 P1001, who presents for her gyn exam. She is having problems which include lack of libido and chronic RLQ pain. At the time of her last annual 03/28/2015 she was having problems with painful intercourse and was referred to [facility] urogynecology. Her pain resolved after she had a introital revision surgery 10/26/2015. When she no longer had painful intercourse, she still had no sex drive. She also stopped having menses after she stopped her BCPs in October 2016. She initially had hot flashes, but those have resolved. She states she is just too tired at the end of the day to have IC. 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 Has not had intercourse x 1 year. Has She also reports that she has been having RLQ pain which radiates to her right sacral area (just above SI joint) for the past year. The pain waxes and wanes, but never resolves. She states the pain is not worsened by movement or eating. One time when she had worse pain, she was also having diarrhea, but normally the pain is not accompanied by nausea or diarrhea/constipation. Denies fever. She had some vaginal spotting on a couple occasions after having a BM, but not in the last few months. Her PCP has done kidney ultrasounds to look for stones, but the ultrasound was negative x 2. The patient's past medical history is notable for a history of inflammatory arthritis and she has most recently been taking methotrexate.  She also has a history of depression/ under a lot of stress and is currently taking Wellbutrin and is seeing a counselor   Her most recent pap smear was obtained 03/28/2015 and was  negative Her most recent mammogram obtained on 03/28/2015 was normal.  There is a positive history of breast cancer in her  paternal grandmother . Genetic testing has been done. The patient tested negative for BRCA 1, negative for BRCA 2, negative for BART, and her lifetime risk of breast cancer is 21.3 %..  There is a family history of ovarian cancer in her paternal aunt. Genetic testing has been done. She tested negative for BRCA 1, negative for BRCA 2, and BART negative.  The patient does not do monthly self breast exams.  The patient does not smoke.  The patient does drink alcohol occasionally.  The patient does not use illegal drugs.  The patient exercises by doing Tai Chi  The patient does get adequate calcium in her diet.  She had a recent cholesterol screen in 2018 (PCP Dr Rosanna Randy) that was borderline but was decreasing after stopping Morrie Sheldon.     Review of Systems: Review of Systems  Constitutional: Negative for chills, fever and weight loss.       Positive for weight gain  HENT: Negative for congestion, sinus pain and sore throat.   Eyes: Negative for blurred vision and pain.  Respiratory: Negative for hemoptysis, shortness of breath and wheezing.   Cardiovascular: Negative for chest pain, palpitations and leg swelling.  Gastrointestinal: Positive for abdominal pain (RLQ). Negative for blood in stool, diarrhea, heartburn, nausea and vomiting.  Genitourinary: Negative for dysuria, frequency, hematuria and urgency.  Musculoskeletal: Positive for back pain and joint pain. Negative for myalgias.  Skin: Negative for itching and rash.  Neurological: Negative for dizziness, tingling and headaches.  Endo/Heme/Allergies: Negative for environmental allergies and polydipsia. Does not bruise/bleed easily.       Negative for hirsutism. Positive for amenorrhea and decreased libido.   Psychiatric/Behavioral: Negative for depression. The patient is nervous/anxious. The patient does not have insomnia.     Past Medical History:  Past Medical History:  Diagnosis Date  . Allergic rhinitis   . Alopecia   . Anxiety    . Arthritis   . Calculus of kidney   . Carpal tunnel syndrome   . Depression   . Dyspareunia, female   . Eczema   . Family history of breast cancer    paternal GM. negative BRCA and BART testing 2014. lifetime risk 21.3%  . Family history of ovarian cancer    paternal aunt in her 30s  . GERD (gastroesophageal reflux disease)   . IBS (irritable bowel syndrome)   . Vitamin B12 deficiency neuropathy ([place])   . Vitamin D deficiency     Past Surgical History:  Past Surgical History:  Procedure Laterality Date  . [place] GLAND CYST EXCISION  2007  . CESAREAN SECTION  10/27/2010  . CHOLECYSTECTOMY  10/2006  . Introital revision  10/26/2015    Family History:  Family History  Problem Relation Age of Onset  . Hypertension Mother   . Diabetes Mother   . Sleep apnea Mother   . Hyperlipidemia Father   . Sleep apnea Father   . Colon cancer Father 43  . Heart attack Father   . Hypertension Father   . Healthy Sister   . Hypertension Brother   . Cancer Maternal Grandfather        lung cancer  . Kidney disease Paternal Grandfather   . Breast cancer Paternal Grandmother 16  . Diabetes Paternal Aunt   . Ovarian cancer Paternal Aunt 63  . Lung cancer Other   . Pancreatic cancer Other   . Cancer Maternal Uncle 53       tongue    Social History:  Social History   Social History  . Marital status: Married    Spouse name: N/A  . Number of children: 1  . Years of education: N/A   Occupational History  . Teacher    Social History Main Topics  . Smoking status: Never Smoker  . Smokeless tobacco: Never Used  . Alcohol use 0.0 - 2.4 oz/week  . Drug use: No  . Sexual activity: Not Currently    Birth control/ protection: Post-menopausal   Other Topics Concern  . Not on file   Social History Narrative  . No narrative on file    Allergies:  Allergies  Allergen Reactions  . Indomethacin     severe H/As    Medications: Prior to Admission medications   Medication  Sig Start Date End Date Taking? Authorizing Provider  buPROPion (WELLBUTRIN XL) 300 MG 24 hr tablet Take 1 tablet (300 mg total) by mouth daily. 07/22/17   Jerrol Banana., MD  cetirizine (ZYRTEC) 10 MG tablet Take 10 mg by mouth.    [provider]  Cholecalciferol (VITAMIN D3) 2000 UNITS capsule Take by mouth.    [provider]  dicyclomine (BENTYL) 20 MG tablet Take 1 tablet (20 mg total) by mouth every 6 (six) hours. As needed for diarrhea or abdominal pain 07/31/17   Carmon Ginsberg, PA  Flaxseed, Linseed, (FLAX SEED OIL PO) Take 1,400 mg by mouth daily.    [provider]  folic acid (FOLVITE) 1 MG tablet folic acid 1 mg tablet  Take 1 tablet every day by oral route.    [provider]  methotrexate (RHEUMATREX) 2.5 MG tablet 4 tablets once a week    [provider]  Omega-3 Fatty Acids (FISH OIL PO) Take 2,000 mg by mouth 2 (two) times daily.    [provider]  ranitidine (ZANTAC) 150 MG tablet Take 1 tablet (150 mg total) by mouth 2 (two) times daily. 05/19/17   Jerrol Banana., MD    Physical Exam Vitals: BP 128/82   Pulse (!) 107   Ht _0  (1.676 m)   Wt 209 lb (94.8 kg)   LMP 09/07/2015   BMI 33.73 kg/m   General: WF in NAD HEENT: normocephalic, anicteric Neck: no thyroid enlargement, no palpable nodules, no cervical lymphadenopathy  Pulmonary: No increased work of breathing, CTAB Cardiovascular: RRR, without murmur  Breast: Breast symmetrical, no tenderness, no palpable nodules or masses, no skin or nipple retraction present, no nipple discharge.  No axillary, infraclavicular or supraclavicular lymphadenopathy. Abdomen: Soft, transient tenderness in RLQ, non-distended.  Umbilicus without lesions.  No hepatomegaly or masses palpable. No evidence of hernia. Genitourinary:  External: Normal external female genitalia.  Normal urethral meatus, normal Bartholin's and Skene's glands.    Vagina: Normal vaginal  mucosa, no evidence of prolapse.    Cervix: Grossly normal in appearance, no bleeding, non-tender  Uterus: Anteverted, normal size, shape, and consistency, mobile, and non-tender  Adnexa: No adnexal masses, non-tender  Rectal: deferred  Lymphatic: no evidence of inguinal lymphadenopathy Extremities: no edema, erythema, or tenderness Neurologic: Grossly intact Psychiatric: mood appropriate, affect full BAck: negative CVAT    Assessment: 46 y.o. postmenopausal gyn exam RLQ pain radiating to right sacral area Decreased libido Increased risk breast cancer due to family history Family history of ovarian cancer Plan:   1) Breast cancer screening - recommend monthly self breast exam and annual screening mammogram. Mammogram was ordered today. Patient to schedule mammogram at [facility]. Will discuss having MYRISK update testing when she returns for a follow up visit.  2) Pelvic ultrasound and follow up in 2 weeks. Discuss decreased libido further. Did discuss HRT to help with libido.  3) Cervical cancer screening - Pap was done.   4)Colon cancer screening: FIT test. Given home collection kit.  5) Routine healthcare maintenance including cholesterol and diabetes screening managed by PCP   Dalia Heading, CNM

## 2017-08-24 ENCOUNTER — Other Ambulatory Visit: Payer: Self-pay | Admitting: Certified Nurse Midwife

## 2017-08-26 ENCOUNTER — Encounter: Payer: Self-pay | Admitting: Certified Nurse Midwife

## 2017-08-26 LAB — IGP, APTIMA HPV
HPV Aptima: NEGATIVE
PAP SMEAR COMMENT: 0

## 2017-08-30 LAB — FECAL OCCULT BLOOD, IMMUNOCHEMICAL: Fecal Occult Bld: NEGATIVE

## 2017-09-10 ENCOUNTER — Ambulatory Visit (INDEPENDENT_AMBULATORY_CARE_PROVIDER_SITE_OTHER): Payer: BC Managed Care – PPO

## 2017-09-10 ENCOUNTER — Encounter: Payer: Self-pay | Admitting: Certified Nurse Midwife

## 2017-09-10 ENCOUNTER — Ambulatory Visit (INDEPENDENT_AMBULATORY_CARE_PROVIDER_SITE_OTHER): Payer: BC Managed Care – PPO | Admitting: Certified Nurse Midwife

## 2017-09-10 VITALS — BP 128/82 | HR 90 | Ht 66.0 in | Wt 209.0 lb

## 2017-09-10 DIAGNOSIS — R6882 Decreased libido: Secondary | ICD-10-CM | POA: Diagnosis not present

## 2017-09-10 DIAGNOSIS — M5489 Other dorsalgia: Secondary | ICD-10-CM | POA: Diagnosis not present

## 2017-09-10 DIAGNOSIS — R1031 Right lower quadrant pain: Secondary | ICD-10-CM | POA: Diagnosis not present

## 2017-09-10 DIAGNOSIS — Z803 Family history of malignant neoplasm of breast: Secondary | ICD-10-CM

## 2017-09-10 DIAGNOSIS — Z8041 Family history of malignant neoplasm of ovary: Secondary | ICD-10-CM

## 2017-09-10 DIAGNOSIS — Z79890 Hormone replacement therapy (postmenopausal): Secondary | ICD-10-CM

## 2017-09-10 MED ORDER — ESTRADIOL-NORETHINDRONE ACET 0.5-0.1 MG PO TABS: 1.0000 | ORAL_TABLET | Freq: Every day | ORAL | 3 refills | Status: DC

## 2017-09-10 NOTE — Progress Notes (Signed)
  HPI: 46 year old menopausal female who complained of RLQ pain and decreased libido at her recent annual exam. She presents for a follow up to an ultrasound that she had today and to start hormone replacement. She is also interested in [facility] Update testing. Abdominal Pain  Her RLQ pain is constant and sometimes radiates to her right sacral area (just above SI joint) for the past year. The pain waxes and wanes, but never resolves. She states the pain is not worsened by movement or eating. One time when she had worse pain, she was also having diarrhea, but normally the pain is not accompanied by nausea or diarrhea/constipation. Her PCP had ordered an an X ray of her lower back and it revealed DDD.  Ultrasound demonstrates no masses and a endometrial stripe of 3.9 mm.  These findings are normal and do not explain her RLQ pain.   PMHx: She  has a past medical history of Allergic rhinitis; Alopecia; Anxiety; Arthritis; Calculus of kidney; Carpal tunnel syndrome; Depression; Dyspareunia, female; Eczema; Family history of breast cancer; Family history of ovarian cancer; GERD (gastroesophageal reflux disease); IBS (irritable bowel syndrome); Vitamin B12 deficiency neuropathy (HCC); and Vitamin D deficiency. Also,  has a past surgical history that includes Cesarean section (10/27/2010); Cholecystectomy (10/2006); Bartholin gland cyst excision (2007); and Introital revision (10/26/2015)., family history includes Breast cancer (age of onset: 73) in her paternal grandmother; Cancer in her maternal grandfather; Cancer (age of onset: 24) in her maternal uncle; Colon cancer (age of onset: 22) in her father; Diabetes in her mother and paternal aunt; Healthy in her sister; Heart attack in her father; Hyperlipidemia in her father; Hypertension in her brother, father, and mother; Kidney disease in her paternal grandfather; Lung cancer in her other; Ovarian cancer (age of onset: 32) in her paternal aunt; Pancreatic cancer in  her other; Sleep apnea in her father and mother.,  reports that she has never smoked. She has never used smokeless tobacco. She reports that she drinks alcohol. She reports that she does not use drugs.  She has a current medication list which includes the following prescription(s): bupropion, cetirizine, vitamin d3, dicyclomine, flaxseed (linseed), folic acid, methotrexate, omega-3 fatty acids, ranitidine, tofacitinib citrate, and estradiol-norethindrone acet. Also, is allergic to indomethacin.  ROS-see HPI  Objective: BP 128/82   Pulse 90   Ht 5\' 6"  (1.676 m)   Wt 209 lb (94.8 kg)   LMP 09/07/2015   BMI 33.73 kg/m   Physical examination Constitutional NAD, Conversant  Skin No rashes, lesions or ulceration.   Extremities: Moves all appropriately.  Normal ROM for age. No lymphadenopathy.  Neuro: Grossly intact  Psych: Oriented to PPT.  Normal mood. Normal affect.   Assessment/ Plan: RLQ pain unsure etiology/ normal pelvic ultrasound  Family history of colon, ovarian and breast cancer-MYRISK Update drawn-will call with results  Decreased libido-desires to try HT to see if libido improves  DIscussed pros and cons and risks of HT including but not limited to increased risk of DVT, MI, stroke and a slight increased risk of breast cancer  RX for Activella 0.5/0.1 sent to pharmacy #30/RF x 3. Follow up in 6-8 weeks  09/09/2015, CNM

## 2017-09-16 ENCOUNTER — Ambulatory Visit
Admission: RE | Admit: 2017-09-16 | Discharge: 2017-09-16 | Disposition: A | Discharge status: Home / Self Care | Payer: BC Managed Care – PPO | Source: Ambulatory Visit | Attending: Certified Nurse Midwife | Admitting: Certified Nurse Midwife

## 2017-09-16 DIAGNOSIS — Z1239 Encounter for other screening for malignant neoplasm of breast: Secondary | ICD-10-CM

## 2017-09-16 DIAGNOSIS — Z1231 Encounter for screening mammogram for malignant neoplasm of breast: Secondary | ICD-10-CM | POA: Insufficient documentation

## 2017-09-25 ENCOUNTER — Telehealth: Payer: Self-pay | Admitting: Certified Nurse Midwife

## 2017-09-25 ENCOUNTER — Inpatient Hospital Stay
Admission: RE | Admit: 2017-09-25 | Discharge: 2017-09-25 | Disposition: A | Discharge status: Home / Self Care | Payer: Self-pay | Source: Ambulatory Visit | Attending: *Deleted | Admitting: *Deleted

## 2017-09-25 ENCOUNTER — Other Ambulatory Visit: Payer: Self-pay | Admitting: *Deleted

## 2017-09-25 DIAGNOSIS — Z9289 Personal history of other medical treatment: Secondary | ICD-10-CM

## 2017-09-25 NOTE — Telephone Encounter (Signed)
Pt is calling wanting to know her Mammogram results. Please advise

## 2017-09-28 ENCOUNTER — Encounter: Payer: Self-pay | Admitting: Certified Nurse Midwife

## 2017-09-28 ENCOUNTER — Encounter: Payer: Self-pay | Admitting: Obstetrics and Gynecology

## 2017-09-28 NOTE — Telephone Encounter (Signed)
Please contact with results. I did not see the report. Thank you!

## 2017-09-28 NOTE — Telephone Encounter (Signed)
Please tell her that her mammogram was normal or negative. Thanks, [employer]

## 2017-09-28 NOTE — Telephone Encounter (Signed)
Left msg for pt that mammo was normal.

## 2017-10-19 ENCOUNTER — Other Ambulatory Visit: Payer: Self-pay | Admitting: Certified Nurse Midwife

## 2017-11-04 ENCOUNTER — Encounter: Payer: Self-pay | Admitting: Family Medicine

## 2017-11-04 ENCOUNTER — Ambulatory Visit: Payer: BC Managed Care – PPO | Admitting: Family Medicine

## 2017-11-04 VITALS — BP 140/80 | HR 106 | Temp 98.2°F | Resp 16 | Wt 214.0 lb

## 2017-11-04 DIAGNOSIS — R6882 Decreased libido: Secondary | ICD-10-CM | POA: Diagnosis not present

## 2017-11-04 DIAGNOSIS — K219 Gastro-esophageal reflux disease without esophagitis: Secondary | ICD-10-CM | POA: Diagnosis not present

## 2017-11-04 DIAGNOSIS — M069 Rheumatoid arthritis, unspecified: Secondary | ICD-10-CM

## 2017-11-04 DIAGNOSIS — R51 Headache: Secondary | ICD-10-CM

## 2017-11-04 DIAGNOSIS — F411 Generalized anxiety disorder: Secondary | ICD-10-CM

## 2017-11-04 DIAGNOSIS — J309 Allergic rhinitis, unspecified: Secondary | ICD-10-CM | POA: Diagnosis not present

## 2017-11-04 DIAGNOSIS — R519 Headache, unspecified: Secondary | ICD-10-CM

## 2017-11-04 MED ORDER — IBUPROFEN 600 MG PO TABS: 600.0000 mg | ORAL_TABLET | Freq: Three times a day (TID) | ORAL | 1 refills | Status: DC | PRN

## 2017-11-04 MED ORDER — ISOMETHEPTENE-DICHLORAL-APAP 65-100-325 MG PO CAPS: 1.0000 | ORAL_CAPSULE | Freq: Four times a day (QID) | ORAL | 1 refills | Status: DC | PRN

## 2017-11-04 MED ORDER — RANITIDINE HCL 150 MG PO TABS: 150.0000 mg | ORAL_TABLET | Freq: Two times a day (BID) | ORAL | 3 refills | Status: DC

## 2017-11-04 NOTE — Progress Notes (Signed)
Patient: Carolyn Hays Female    DOB: 05-Feb-1971   46 y.o.   MRN: [id number] Visit Date: 11/04/2017  Today's Provider: Megan Mans, MD   Chief Complaint  Patient presents with  . Anxiety  . Headache   Subjective:    HPI Anxiety- at last OV on 07/22/17. Wellbutrin was increased to 300 mg daily. She reports that emotionally she is feeling much better. However she started having a headache on December 2nd and she has had daily since. Yesterday she started having ear pain in her right ear and sore throat. She reports that other than the headache she does not feel bad. She also reports that her right side of her face feels swollen. Denies any sinus congestion.     Allergies  Allergen Reactions  . Indomethacin     severe H/As     Current Outpatient Medications:  .  buPROPion (WELLBUTRIN XL) 300 MG 24 hr tablet, Take 1 tablet (300 mg total) by mouth daily., Disp: 30 tablet, Rfl: 12 .  cetirizine (ZYRTEC) 10 MG tablet, Take 10 mg by mouth., Disp: , Rfl:  .  Cholecalciferol (VITAMIN D3) 2000 UNITS capsule, Take by mouth., Disp: , Rfl:  .  Estradiol-Norethindrone Acet (ACTIVELLA) 0.5-0.1 MG tablet, Take 1 tablet by mouth daily., Disp: 30 tablet, Rfl: 3 .  Flaxseed, Linseed, (FLAX SEED OIL PO), Take 1,400 mg by mouth daily., Disp: , Rfl:  .  Omega-3 Fatty Acids (FISH OIL PO), Take 2,000 mg by mouth 2 (two) times daily., Disp: , Rfl:  .  ranitidine (ZANTAC) 150 MG tablet, Take 1 tablet (150 mg total) by mouth 2 (two) times daily., Disp: 60 tablet, Rfl: 5 .  Tofacitinib Citrate (XELJANZ XR) 11 MG TB24, Xeljanz XR 11 mg tablet,extended release  Take 1 tablet every day by oral route., Disp: , Rfl:  .  dicyclomine (BENTYL) 20 MG tablet, Take 1 tablet (20 mg total) by mouth every 6 (six) hours. As needed for diarrhea or abdominal pain (Patient not taking: Reported on 11/04/2017), Disp: 28 tablet, Rfl: 0 .  folic acid (FOLVITE) 1 MG tablet, folic acid 1 mg tablet  Take 1 tablet every  day by oral route., Disp: , Rfl:  .  methotrexate (RHEUMATREX) 2.5 MG tablet, 4 tablets once a week, Disp: , Rfl:   Review of Systems  Constitutional: Negative.   HENT: Negative.   Eyes: Negative.   Respiratory: Negative.   Cardiovascular: Negative.   Gastrointestinal: Negative.   Endocrine: Negative.   Genitourinary: Negative.   Musculoskeletal: Negative.   Skin: Negative.   Allergic/Immunologic: Negative.   Neurological: Negative.   Hematological: Negative.   Psychiatric/Behavioral: Negative.     Social History   Tobacco Use  . Smoking status: Never Smoker  . Smokeless tobacco: Never Used  Substance Use Topics  . Alcohol use: Yes    Alcohol/week: 0.0 - 2.4 oz   Objective:   BP 140/80 (BP Location: Left Arm, Patient Position: Sitting, Cuff Size: Large)   Pulse (!) 106   Temp 98.2 F (36.8 C) (Oral)   Resp 16   Wt 214 lb (97.1 kg)   LMP 09/07/2015   SpO2 98%   BMI 34.54 kg/m  Vitals:   11/04/17 1633  BP: 140/80  Pulse: (!) 106  Resp: 16  Temp: 98.2 F (36.8 C)  TempSrc: Oral  SpO2: 98%  Weight: 214 lb (97.1 kg)     Physical Exam  Constitutional: She is oriented to  person, place, and time. She appears well-developed and well-nourished.  Eyes: Conjunctivae and EOM are normal. Pupils are equal, round, and reactive to light.  Neck: Normal range of motion. Neck supple.  Cardiovascular: Normal rate, regular rhythm, normal heart sounds and intact distal pulses.  Pulmonary/Chest: Effort normal and breath sounds normal.  Musculoskeletal: Normal range of motion.  Neurological: She is alert and oriented to person, place, and time. She has normal reflexes.  Skin: Skin is warm and dry.  Psychiatric: She has a normal mood and affect. Her behavior is normal. Judgment and thought content normal.        Assessment & Plan:     1. Anxiety, generalized Improved. Follow up in 6 months  2. Nonintractable headache, unspecified chronicity pattern, unspecified headache  type  - ibuprofen (ADVIL,MOTRIN) 600 MG tablet; Take 1 tablet (600 mg total) by mouth 3 (three) times daily as needed.  Dispense: 90 tablet; Refill: 1 - Ambulatory referral to Neurology - isometheptene-acetaminophen-dichloralphenazone (MIDRIN) 65-100-325 MG capsule; Take 1 capsule by mouth 4 (four) times daily as needed for migraine. Maximum 5 capsules in 12 hours for migraine headaches, 8 capsules in 24 hours for tension headaches.  Dispense: 100 capsule; Refill: 1 3.IBS 4.RA 5.AR     HPI, Exam, and A&P Transcribed under the direction and in the presence of Laporchia Nakajima L. Wendelyn Breslow, MD  Electronically Signed: Silvio Pate, CMA   Coun[facility] [place] Wendelyn Breslow, MD  [facility]

## 2017-11-05 ENCOUNTER — Telehealth: Payer: Self-pay | Admitting: Family Medicine

## 2017-11-05 DIAGNOSIS — R519 Headache, unspecified: Secondary | ICD-10-CM

## 2017-11-05 DIAGNOSIS — R51 Headache: Principal | ICD-10-CM

## 2017-11-05 MED ORDER — ISOMETHEPTENE-DICHLORAL-APAP 65-100-325 MG PO CAPS: 1.0000 | ORAL_CAPSULE | Freq: Four times a day (QID) | ORAL | 1 refills | Status: DC | PRN

## 2017-11-05 NOTE — Telephone Encounter (Signed)
Pt stated that she spoke with someone at [facility] and was advised the Rx for isometheptene-acetaminophen-dichloralphenazone (MIDRIN) 65-100-325 MG capsule can be sent in electronically. Pt is requesting it to be sent in today if possible. Please advise. Thanks TNP

## 2017-11-05 NOTE — Telephone Encounter (Signed)
Please review. Thanks!  

## 2017-11-05 NOTE — Telephone Encounter (Signed)
Not sure why it kept printed. Called rx into pharmacy. Pt advised via VM

## 2017-11-05 NOTE — Telephone Encounter (Signed)
Please send or call in--?controlled?--

## 2017-11-05 NOTE — Telephone Encounter (Signed)
Pt states she did not receive  the Rx for isometheptene-acetaminophen-dichloralphenazone (MIDRIN) 65-100-325 MG capsule and the pharmacy has not rec'd this.    [facility].  HF#[phone]/[id number]

## 2017-11-06 ENCOUNTER — Telehealth: Payer: Self-pay | Admitting: Family Medicine

## 2017-11-06 NOTE — Telephone Encounter (Signed)
Please review-Anastasiya V Hopkins, RMA  

## 2017-11-06 NOTE — Telephone Encounter (Signed)
Pt states the Rx isometheptene-acetaminophen-dichloralphenazone (MIDRIN) 65-100-325 MG capsule  is being discontinued and the pharmacy does not have the medication.  Pt is requesting something different.  [facility].  CB#[phone]  Pt states the ibuprofen (ADVIL,MOTRIN) 600 MG tablet   is helping with the ear pain but not the headache.

## 2017-11-10 ENCOUNTER — Other Ambulatory Visit: Payer: Self-pay

## 2017-11-10 MED ORDER — BUTALBITAL-APAP-CAFFEINE 50-325-40 MG PO TABS: 1.0000 | ORAL_TABLET | Freq: Two times a day (BID) | ORAL | 0 refills | Status: DC | PRN

## 2017-11-10 NOTE — Telephone Encounter (Signed)
Try fioricet q 6 hrs prn,$35,1rf.

## 2017-11-11 ENCOUNTER — Other Ambulatory Visit: Payer: Self-pay | Admitting: Certified Nurse Midwife

## 2017-11-11 ENCOUNTER — Ambulatory Visit: Payer: BC Managed Care – PPO | Admitting: Certified Nurse Midwife

## 2017-11-11 ENCOUNTER — Telehealth: Payer: Self-pay | Admitting: Certified Nurse Midwife

## 2017-11-11 MED ORDER — ESTRADIOL-NORETHINDRONE ACET 0.5-0.1 MG PO TABS: 1.0000 | ORAL_TABLET | Freq: Every day | ORAL | 3 refills | Status: DC

## 2017-11-11 NOTE — Telephone Encounter (Signed)
Patient called and advised that the [facility] update test was negative. Remaining lifetime risk of breast cancer was 19.2%. Encouraged to continue annual mammograms and to do colonoscopies.  Has been taking HT (Activella) for 8 weeks and "is feeling more like" herself. Has had recent onset of headaches and has been seen by her PCP who is referring her to neuro. HT may cause headaches. Will see what the neurologist thinks. Patient missed appointment today...has rescheduled for January. Refill of Activella sent to pharmacy.

## 2017-11-13 ENCOUNTER — Other Ambulatory Visit: Payer: Self-pay | Admitting: Certified Nurse Midwife

## 2017-12-09 ENCOUNTER — Encounter: Payer: Self-pay | Admitting: Family Medicine

## 2017-12-14 ENCOUNTER — Ambulatory Visit: Payer: BC Managed Care – PPO | Admitting: Certified Nurse Midwife

## 2017-12-14 ENCOUNTER — Encounter: Payer: Self-pay | Admitting: Certified Nurse Midwife

## 2017-12-14 VITALS — BP 122/82 | HR 89 | Ht 66.0 in | Wt 212.0 lb

## 2017-12-14 DIAGNOSIS — Z79890 Hormone replacement therapy (postmenopausal): Secondary | ICD-10-CM

## 2017-12-14 MED ORDER — ESTRADIOL-NORETHINDRONE ACET 0.5-0.1 MG PO TABS: 1.0000 | ORAL_TABLET | Freq: Every day | ORAL | 2 refills | Status: DC

## 2017-12-14 NOTE — Progress Notes (Signed)
  History of Present Illness:  Carolyn Hays is a 47 y.o. who was started on Activella approximately 3 months ago. Since that time, she states that her libido and energy level have increased. She wants to continue on her present dose of HT. She has seen a neurologist for her headaches and was diagnosed with trigeminal autonomic.cephalgia. She will be having a MRI of the brain scheduled soon. Her MYRISK test returned negative.  PMHx: She  has a past medical history of Allergic rhinitis, Alopecia, Anxiety, Arthritis, Calculus of kidney, Carpal tunnel syndrome, Depression, Dyspareunia, female, Eczema, Family history of breast cancer, Family history of ovarian cancer, GERD (gastroesophageal reflux disease), IBS (irritable bowel syndrome), Vitamin B12 deficiency neuropathy (HCC), and Vitamin D deficiency. Also,  has a past surgical history that includes Cesarean section (10/27/2010); Cholecystectomy (10/2006); Bartholin gland cyst excision (2007); and Introital revision (10/26/2015)., family history includes Breast cancer (age of onset: 10) in her paternal grandmother; Cancer in her maternal grandfather; Cancer (age of onset: 86) in her maternal uncle; Colon cancer (age of onset: 51) in her father; Diabetes in her mother and paternal aunt; Healthy in her sister; Heart attack in her father; Hyperlipidemia in her father; Hypertension in her brother, father, and mother; Kidney disease in her paternal grandfather; Lung cancer in her other; Ovarian cancer (age of onset: 65) in her paternal aunt; Pancreatic cancer in her other; Sleep apnea in her father and mother.,  reports that  has never smoked. she has never used smokeless tobacco. She reports that she drinks alcohol. She reports that she does not use drugs.  She has a current medication list which includes the following prescription(s): bupropion, butalbital-acetaminophen-caffeine, cetirizine, vitamin d3, estradiol-norethindrone acet, flaxseed (linseed),  ibuprofen, omega-3 fatty acids, ranitidine, and tofacitinib citrate. Also, is allergic to indomethacin.  ROS  Physical Exam:  BP 122/82   Pulse 89   Ht 5\' 6"  (1.676 m)   Wt 212 lb (96.2 kg)   LMP 09/07/2015   BMI 34.22 kg/m  Body mass index is 34.22 kg/m. Constitutional: Well nourished, well developed female in no acute distress.  Abdomen: diffusely non tender to palpation, non distended, and no masses, hernias Neuro: Grossly intact Psych:  Normal mood and affect.    Assessment: Doing well on her HT .  Plan: She will undergo no change in her medical therapy. RX for Activella sent to her pharmacy  She was amenable to this plan and we will see her back for annual/PRN.  09/09/2015, CNM [facility], [facility] 12/14/2017  3:51 PM

## 2017-12-17 ENCOUNTER — Other Ambulatory Visit: Payer: Self-pay | Admitting: Neurology

## 2017-12-17 DIAGNOSIS — G44039 Episodic paroxysmal hemicrania, not intractable: Secondary | ICD-10-CM

## 2017-12-23 ENCOUNTER — Ambulatory Visit: Payer: BC Managed Care – PPO

## 2017-12-28 ENCOUNTER — Ambulatory Visit: Payer: BC Managed Care – PPO

## 2017-12-30 ENCOUNTER — Ambulatory Visit: Payer: BC Managed Care – PPO

## 2017-12-31 ENCOUNTER — Other Ambulatory Visit: Payer: Self-pay | Admitting: Family Medicine

## 2017-12-31 ENCOUNTER — Telehealth: Payer: Self-pay

## 2017-12-31 DIAGNOSIS — R519 Headache, unspecified: Secondary | ICD-10-CM

## 2017-12-31 DIAGNOSIS — R51 Headache: Principal | ICD-10-CM

## 2017-12-31 NOTE — Telephone Encounter (Signed)
Patient called saying that she has started back on [place] and reports that her cholesterol has gone back up. She reports that she needs to start cholesterol meds. Patient uses Therapist, art in [place]. Thanks!

## 2018-02-09 NOTE — Telephone Encounter (Signed)
Will recheck next OV,

## 2018-02-09 NOTE — Telephone Encounter (Signed)
Advised  ED 

## 2018-02-16 ENCOUNTER — Encounter: Payer: Self-pay | Admitting: Family Medicine

## 2018-03-11 ENCOUNTER — Ambulatory Visit: Payer: BC Managed Care – PPO | Admitting: Family Medicine

## 2018-03-11 ENCOUNTER — Encounter: Payer: Self-pay | Admitting: Family Medicine

## 2018-03-11 VITALS — BP 118/80 | HR 86 | Temp 98.6°F | Resp 16 | Wt 207.0 lb

## 2018-03-11 DIAGNOSIS — M0609 Rheumatoid arthritis without rheumatoid factor, multiple sites: Secondary | ICD-10-CM

## 2018-03-11 DIAGNOSIS — F411 Generalized anxiety disorder: Secondary | ICD-10-CM | POA: Diagnosis not present

## 2018-03-11 NOTE — Progress Notes (Signed)
Patient: Carolyn Hays Female    DOB: 25-Aug-1971   47 y.o.   MRN: [id number] Visit Date: 03/11/2018  Today's Provider: Wilhemena Durie, MD   Chief Complaint  Patient presents with  . Form Completion   Subjective:    HPI Pt is here today for forms to be filled out for her employer. She is starting back with ABSS. She reports that she is feeling well.     Allergies  Allergen Reactions  . Indomethacin Other (See Comments)    severe H/As severe H/As     Current Outpatient Medications:  .  buPROPion (WELLBUTRIN XL) 300 MG 24 hr tablet, Take 1 tablet (300 mg total) by mouth daily., Disp: 30 tablet, Rfl: 12 .  butalbital-acetaminophen-caffeine (FIORICET, ESGIC) 50-325-40 MG tablet, Take 1 tablet by mouth 2 (two) times daily as needed for headache., Disp: 14 tablet, Rfl: 0 .  cetirizine (ZYRTEC) 10 MG tablet, Take 10 mg by mouth., Disp: , Rfl:  .  Cholecalciferol (VITAMIN D3) 2000 UNITS capsule, Take by mouth., Disp: , Rfl:  .  Flaxseed, Linseed, (FLAX SEED OIL PO), Take 1,400 mg by mouth daily., Disp: , Rfl:  .  ibuprofen (ADVIL,MOTRIN) 600 MG tablet, TAKE 1 TABLET(600 MG) BY MOUTH THREE TIMES DAILY AS NEEDED, Disp: 90 tablet, Rfl: 4 .  Omega-3 Fatty Acids (FISH OIL PO), Take 2,000 mg by mouth 2 (two) times daily., Disp: , Rfl:  .  ranitidine (ZANTAC) 150 MG tablet, Take 1 tablet (150 mg total) by mouth 2 (two) times daily., Disp: 180 tablet, Rfl: 3 .  Tofacitinib Citrate (XELJANZ XR) 11 MG TB24, Xeljanz XR 11 mg tablet,extended release  Take 1 tablet every day by oral route., Disp: , Rfl:  .  Estradiol-Norethindrone Acet (ACTIVELLA) 0.5-0.1 MG tablet, Take 1 tablet by mouth daily. (Patient not taking: Reported on 03/11/2018), Disp: 90 tablet, Rfl: 2  Review of Systems  Constitutional: Negative.   HENT: Negative.   Eyes: Negative.   Respiratory: Negative.   Cardiovascular: Negative.   Gastrointestinal: Negative.   Endocrine: Negative.   Genitourinary: Negative.     Musculoskeletal: Negative.   Skin: Negative.   Allergic/Immunologic: Negative.   Neurological: Negative.   Hematological: Negative.   Psychiatric/Behavioral: Negative.     Social History   Tobacco Use  . Smoking status: Never Smoker  . Smokeless tobacco: Never Used  Substance Use Topics  . Alcohol use: Yes    Alcohol/week: 0.0 - 2.4 oz   Objective:   BP 118/80 (BP Location: Left Arm, Patient Position: Sitting, Cuff Size: Normal)   Pulse 86   Temp 98.6 F (37 C) (Oral)   Resp 16   Wt 207 lb (93.9 kg)   LMP 09/07/2015   BMI 33.41 kg/m  Vitals:   03/11/18 1610  BP: 118/80  Pulse: 86  Resp: 16  Temp: 98.6 F (37 C)  TempSrc: Oral  Weight: 207 lb (93.9 kg)     Physical Exam  Constitutional: She is oriented to person, place, and time. She appears well-developed and well-nourished.  HENT:  Head: Normocephalic and atraumatic.  Eyes: No scleral icterus.  Neck: No thyromegaly present.  Cardiovascular: Normal rate, regular rhythm and normal heart sounds.  Pulmonary/Chest: Effort normal and breath sounds normal.  Abdominal: Soft.  Neurological: She is alert and oriented to person, place, and time.  Skin: Skin is warm and dry.  Psychiatric: She has a normal mood and affect. Her behavior is normal. Judgment and thought  content normal.        Assessment & Plan:     Hyperlipidemia Last LDL 127. Repeat later in year.  Seronegative RA Records unavailable on Epic. Now on Morrie Sheldon Form filled out for teaching. Pt may need MMR update.  I have done the exam and reviewed the chart and it is accurate to the best of my knowledge. Development worker, community has been used and  any errors in dictation or transcription are unintentional. Miguel Aschoff M.D. [facility] .      Tristin Gladman Cranford Mon, MD  [facility]

## 2018-05-10 ENCOUNTER — Ambulatory Visit: Payer: Self-pay | Admitting: Family Medicine

## 2018-05-14 ENCOUNTER — Encounter: Payer: Self-pay | Admitting: Family Medicine

## 2018-05-14 ENCOUNTER — Ambulatory Visit: Payer: BC Managed Care – PPO | Admitting: Family Medicine

## 2018-05-14 VITALS — BP 140/100 | HR 96 | Temp 98.9°F | Resp 16 | Wt 209.6 lb

## 2018-05-14 DIAGNOSIS — J029 Acute pharyngitis, unspecified: Secondary | ICD-10-CM

## 2018-05-14 LAB — POCT RAPID STREP A (OFFICE): Rapid Strep A Screen: NEGATIVE

## 2018-05-14 MED ORDER — AZITHROMYCIN 250 MG PO TABS: ORAL_TABLET | ORAL | 0 refills | Status: DC

## 2018-05-14 NOTE — Progress Notes (Signed)
  Subjective:     Patient ID: Carolyn Hays, female   DOB: January 23, 1971, 47 y.o.   MRN: [id number] Chief Complaint  Patient presents with  . Sore Throat    Patient comes in office today with complaints of sore throat for two weeks and fever high of 100.3 since yesterday. Patient states tha tshe has had a dry cough, ear pain/pressure and nausea. Patient has been taking otc ibuprofen and delsym for relief.    HPI States she has felt fatigued for the last two weeks with associated sore throat. Early this week she developed increased sore throat accompanied by fever as high as 101.3 yesterday and a dry cough. Minimal sinus congestion. 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 States she has had EBV previously.     Objective:   Physical Exam  Constitutional: She appears well-developed and well-nourished. She does not appear ill. No distress.  Ears: T.M's intact without inflammation Sinuses: non-tender Throat: moderate tonsillar enlargement L > R without erythema/exudate. Neck: no cervical adenopathy Lungs: clear     Assessment:    1. Pharyngitis, unspecified etiology; Associated with cough: will cover with Zithromax - POCT rapid strep A    Plan:    Further f/u prn new sx or not improving. Continue Delsym for cough.

## 2018-05-14 NOTE — Patient Instructions (Signed)
Continue Delsym for cough. Let us know if new symptoms or not improving.

## 2018-06-29 ENCOUNTER — Telehealth: Payer: Self-pay

## 2018-06-29 NOTE — Telephone Encounter (Signed)
We can refer her to the vascular surgeons here in town but I have no specific recommendations for a particular group.

## 2018-06-29 NOTE — Telephone Encounter (Signed)
Patient called saying that she is wanting to get her spider veins/varicose veins fixed. Do you have any recommendations or know any good places in town where she could go? Please advise. Ok to leave detailed message on VM. Contact info is correct. Thanks!

## 2018-06-29 NOTE — Telephone Encounter (Signed)
Patient advised, please proceed with referral. Carolyn Hays

## 2018-06-29 NOTE — Telephone Encounter (Signed)
Please review chart and advise. KW 

## 2018-07-20 IMAGING — MG 2D DIGITAL SCREENING BILATERAL MAMMOGRAM WITH CAD AND ADJUNCT TO
8 of 12 series · 8 of 28 positions shown · non-contrast
Comparison: Previous exam(s).

CLINICAL DATA: Screening.

EXAM:
2D DIGITAL SCREENING BILATERAL MAMMOGRAM WITH CAD AND ADJUNCT TOMO

[R MLO synth-2D]
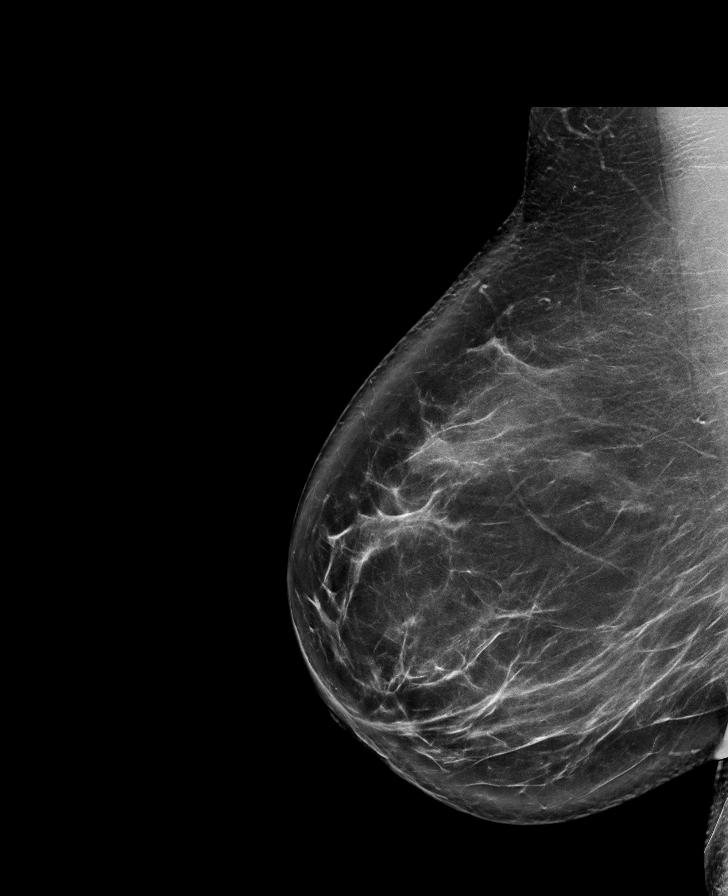

[R CC synth-2D]
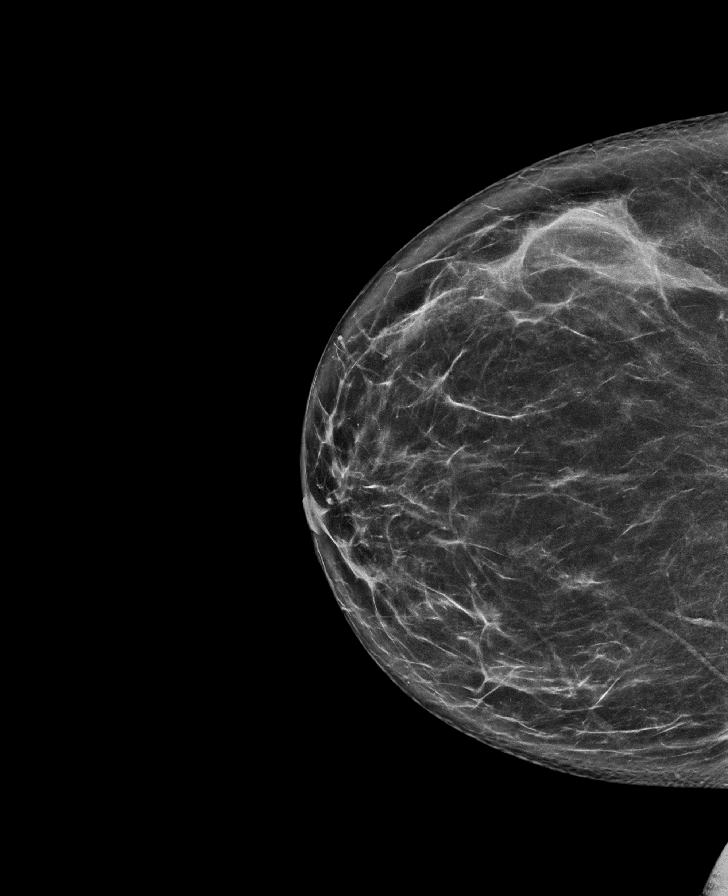

[L MLO]
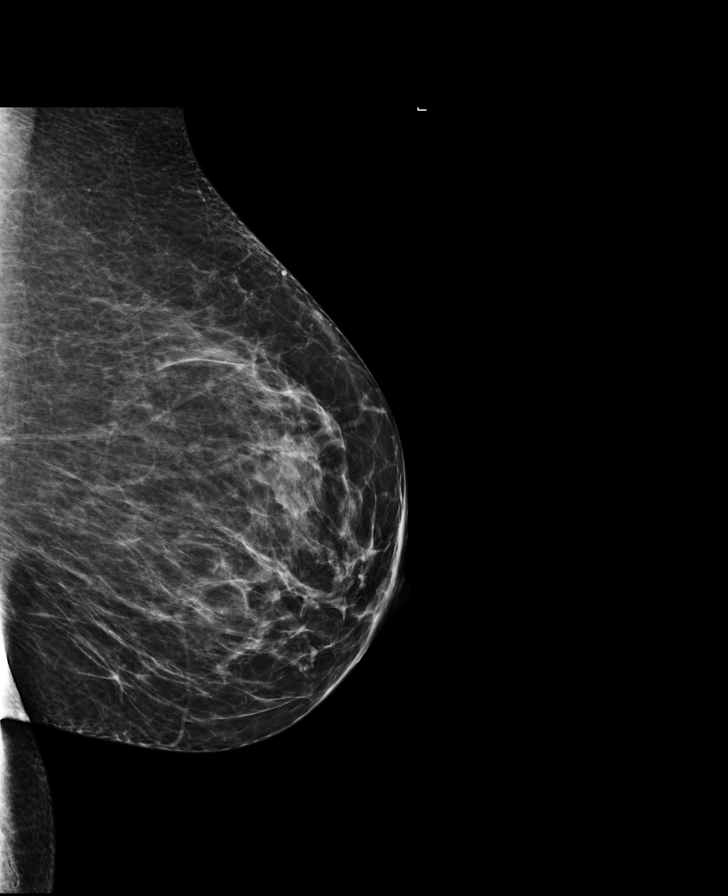

[R MLO]
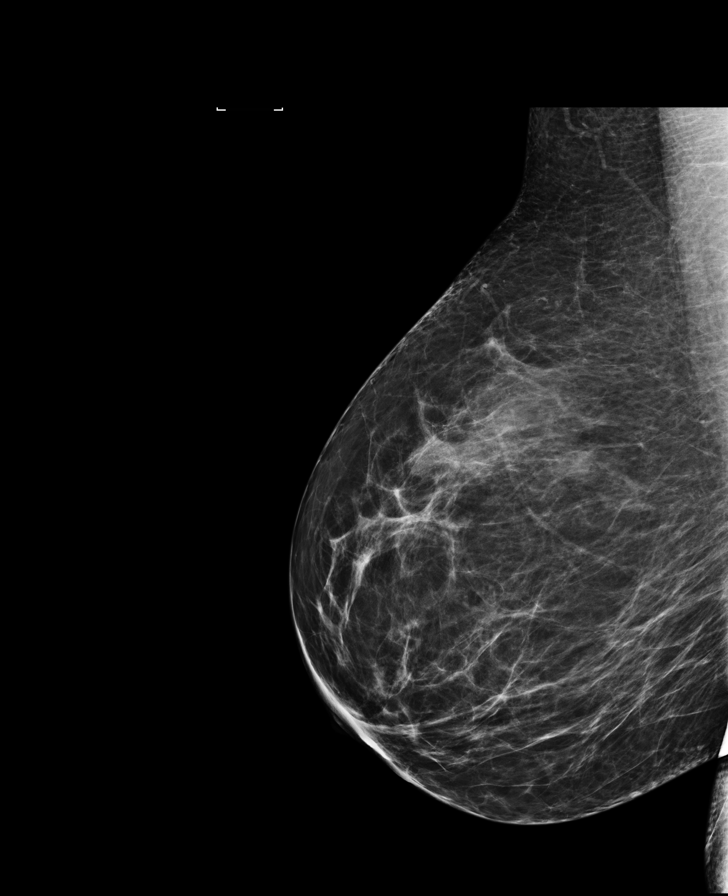

[R CC]
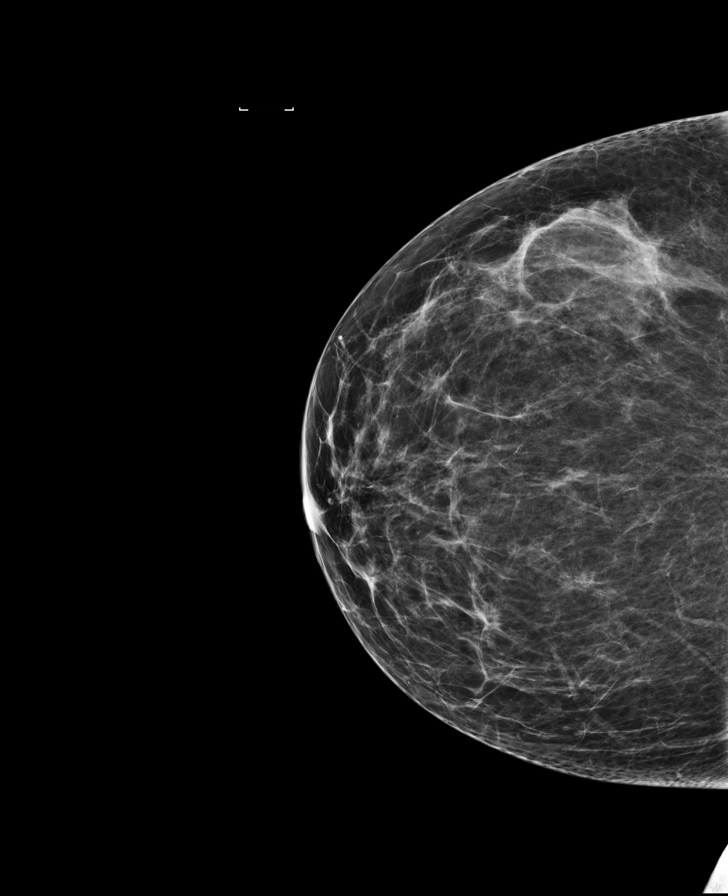

[L CC]
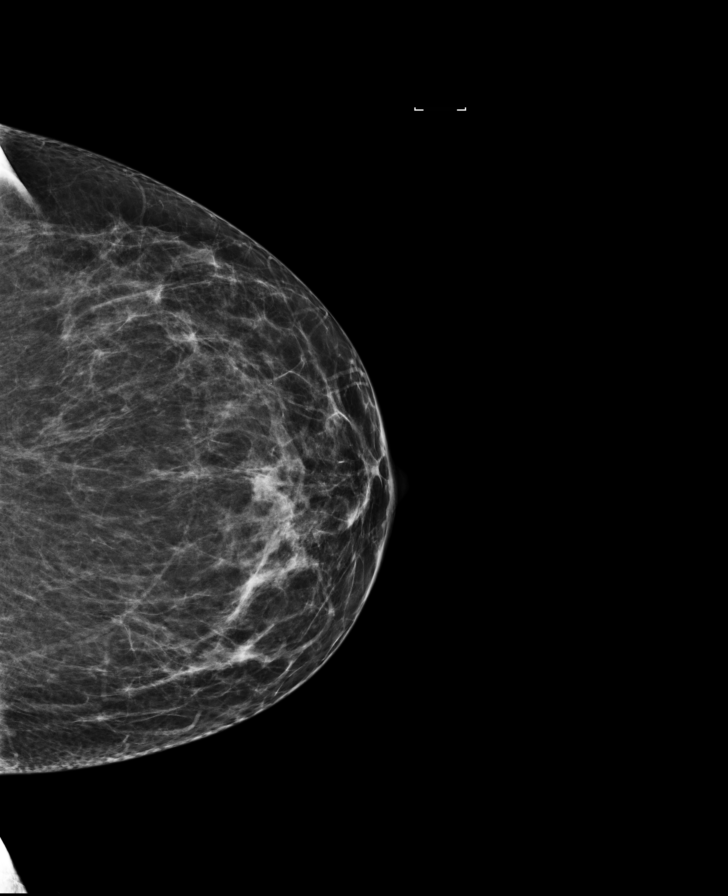

[L CC synth-2D]
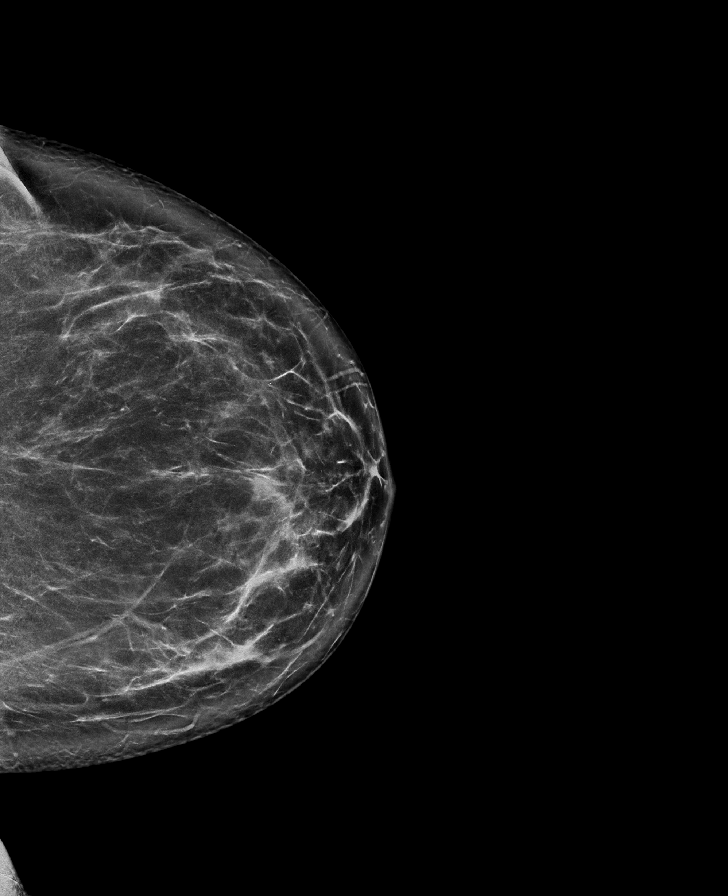

[L MLO synth-2D]
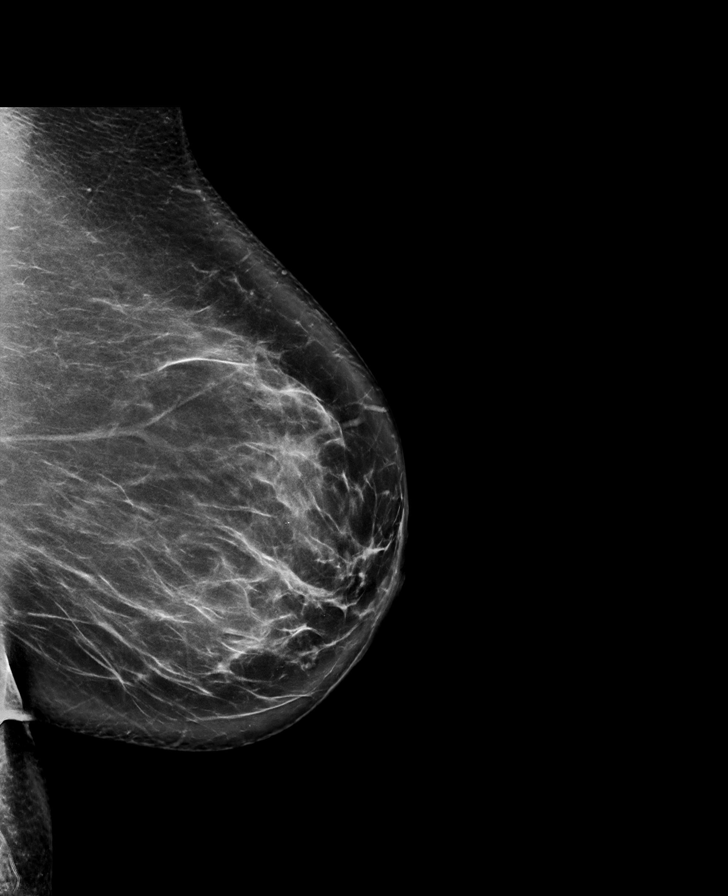

[8 of 28 positions shown; findings below may reference images not displayed]

ACR Breast Density Category b: There are scattered areas of
fibroglandular density.
FINDINGS: There are no findings suspicious for malignancy. Images were
processed with CAD.
IMPRESSION: No mammographic evidence of malignancy. A result letter of this
screening mammogram will be mailed directly to the patient.

RECOMMENDATION:
Screening mammogram in one year. (Code:97-6-RS4)

BI-RADS CATEGORY  1: Negative.

## 2018-09-27 ENCOUNTER — Other Ambulatory Visit: Payer: Self-pay | Admitting: Family Medicine

## 2018-09-27 ENCOUNTER — Other Ambulatory Visit: Payer: Self-pay | Admitting: Certified Nurse Midwife

## 2018-09-27 DIAGNOSIS — Z1231 Encounter for screening mammogram for malignant neoplasm of breast: Secondary | ICD-10-CM

## 2018-10-06 ENCOUNTER — Ambulatory Visit
Admission: RE | Admit: 2018-10-06 | Discharge: 2018-10-06 | Disposition: A | Discharge status: Home / Self Care | Payer: BLUE CROSS/BLUE SHIELD | Source: Ambulatory Visit | Attending: Family Medicine | Admitting: Family Medicine

## 2018-10-06 DIAGNOSIS — Z1231 Encounter for screening mammogram for malignant neoplasm of breast: Secondary | ICD-10-CM | POA: Diagnosis not present

## 2018-11-04 ENCOUNTER — Other Ambulatory Visit: Payer: Self-pay | Admitting: Family Medicine

## 2018-11-04 DIAGNOSIS — K219 Gastro-esophageal reflux disease without esophagitis: Secondary | ICD-10-CM

## 2018-11-09 ENCOUNTER — Ambulatory Visit (INDEPENDENT_AMBULATORY_CARE_PROVIDER_SITE_OTHER): Payer: BLUE CROSS/BLUE SHIELD | Admitting: Certified Nurse Midwife

## 2018-11-09 ENCOUNTER — Encounter: Payer: Self-pay | Admitting: Certified Nurse Midwife

## 2018-11-09 VITALS — BP 132/84 | HR 105 | Ht 66.0 in | Wt 212.0 lb

## 2018-11-09 DIAGNOSIS — N939 Abnormal uterine and vaginal bleeding, unspecified: Secondary | ICD-10-CM

## 2018-11-09 DIAGNOSIS — Z01419 Encounter for gynecological examination (general) (routine) without abnormal findings: Secondary | ICD-10-CM | POA: Diagnosis not present

## 2018-11-09 DIAGNOSIS — Z124 Encounter for screening for malignant neoplasm of cervix: Secondary | ICD-10-CM

## 2018-11-09 DIAGNOSIS — N898 Other specified noninflammatory disorders of vagina: Secondary | ICD-10-CM | POA: Diagnosis not present

## 2018-11-09 DIAGNOSIS — B3731 Acute candidiasis of vulva and vagina: Secondary | ICD-10-CM

## 2018-11-09 DIAGNOSIS — B373 Candidiasis of vulva and vagina: Secondary | ICD-10-CM

## 2018-11-09 DIAGNOSIS — Z1211 Encounter for screening for malignant neoplasm of colon: Secondary | ICD-10-CM

## 2018-11-09 DIAGNOSIS — B9689 Other specified bacterial agents as the cause of diseases classified elsewhere: Secondary | ICD-10-CM

## 2018-11-09 DIAGNOSIS — L292 Pruritus vulvae: Secondary | ICD-10-CM | POA: Diagnosis not present

## 2018-11-09 DIAGNOSIS — N760 Acute vaginitis: Secondary | ICD-10-CM

## 2018-11-09 MED ORDER — TINIDAZOLE 500 MG PO TABS: 1000.0000 mg | ORAL_TABLET | Freq: Every day | ORAL | 0 refills | Status: AC

## 2018-11-09 MED ORDER — NYSTATIN 100000 UNIT/GM EX OINT: 1.0000 "application " | TOPICAL_OINTMENT | Freq: Two times a day (BID) | CUTANEOUS | 0 refills | Status: DC

## 2018-11-09 MED ORDER — FLUCONAZOLE 150 MG PO TABS: ORAL_TABLET | ORAL | 0 refills | Status: DC

## 2018-11-09 MED ORDER — TRIAMCINOLONE ACETONIDE 0.1 % EX OINT: 1.0000 "application " | TOPICAL_OINTMENT | Freq: Two times a day (BID) | CUTANEOUS | 0 refills | Status: DC

## 2018-11-09 NOTE — Progress Notes (Addendum)
Gynecology Annual Exam  PCP: Jerrol Banana., MD  Chief Complaint:  Chief Complaint  Patient presents with  . Gynecologic Exam    Vaginal itching, discharge.     History of Present Illness:Carolyn Hays is a 47 year old Caucasian/White female, G1 P1001, who presents for her gyn exam. She is having problems with vaginal discharge and genital itching and burning x 6-7 weeks.She has used Diflucan tablets x 2, and applied antifungal creams, hydrocortisone cream and Neosporin externally. She has not used anything externally x 2-3 weeks and has finally been feeling better.  She stopped having menses after she stopped taking OCPs in October 2016. She initially had hot flashes, but those have resolved. She has had some spotting about 4 times in the last year. This usually occurs when wiping. Endometrial stripe last year was 3.3m.  She was started on Activella last year for her decreased libido, and initially reported increased libido and energy level, but then stopped taking it after 3-6 months when she no longer found it helpful. Since her last annual exam 08/21/2017, she has had no other significant changes in her health.  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  She is not sexually active. She has a history of  painful [facility] Her pain resolved after she had a introital revision surgery 10/26/2015. When she no longer had painful intercourse, she still had no sex drive.  The patient's past medical history is notable for a history of inflammatory arthritis.   She also has a history of depression/anxiety. Is no longer taking Wellbutrin, since changing jobs.  Her most recent pap smear was obtained 08/21/2017 and was ASCUS with negative HRHPV. Her most recent mammogram obtained on 10/06/2018 was normal.  There is a positive history of breast cancer in her paternal grandmother and ovarian cancer in  her paternal aunt. Genetic testing has been done. The patient tested negative for BRCA 1, negative for BRCA 2, negative for BART, and last year she had a MSumnerupdate which was negative. Her lifetime risk of breast cancer is 19.2%  The patient does not do monthly self breast exams.  The patient does not smoke.  The patient does drink alcohol occasionally The patient does not use illegal drugs.  The patient exercises 25 minutes/day The patient does get adequate calcium in her diet.  She had a recent cholesterol screen in 2019 and is being followed since restarting XMorrie Sheldon   Review of Systems: Review of Systems  Constitutional: Positive for malaise/fatigue. Negative for chills, fever and weight loss.       Positive for weight gain  HENT: Negative for congestion, sinus pain and sore throat.   Eyes: Negative for blurred vision and pain.  Respiratory: Negative for hemoptysis, shortness of breath and wheezing.   Cardiovascular: Negative for chest pain, palpitations and leg swelling.  Gastrointestinal: Positive for diarrhea (with the stomach virus 12/3), nausea and vomiting. Negative for blood in stool and heartburn.  Genitourinary: Negative for dysuria, frequency, hematuria and urgency.       Positive for vaginal discharge, genital itching and burning, and painful intercourse. Positive for spotting Positive for dribbling after urination.  Musculoskeletal: Negative for back pain, joint pain and myalgias.  Skin: Negative for itching and rash.  Neurological: Negative for dizziness, tingling and headaches.  Endo/Heme/Allergies: Negative for environmental allergies and polydipsia. Does not bruise/bleed easily.  Negative for hirsutism. Positive for amenorrhea and decreased libido.   Psychiatric/Behavioral: Negative for depression. The patient is nervous/anxious (but less than last year). The patient does not have insomnia.     Past Medical History:  Past Medical History:  Diagnosis Date    . Allergic rhinitis   . Alopecia   . Anxiety   . Arthritis   . Calculus of kidney   . Carpal tunnel syndrome   . Depression   . Dyspareunia, female   . Eczema   . Family history of breast cancer    Neg BRCA/BART 2014; Neg MyRisk update 10/18; riskscore=19.2%/IBIS=19.1%  . Family history of ovarian cancer    paternal aunt in her 47s  . GERD (gastroesophageal reflux disease)   . IBS (irritable bowel syndrome)   . Vitamin B12 deficiency neuropathy ([place])   . Vitamin D deficiency     Past Surgical History:  Past Surgical History:  Procedure Laterality Date  . [place] GLAND CYST EXCISION  2007  . CESAREAN SECTION  10/27/2010  . CHOLECYSTECTOMY  10/2006  . COLONOSCOPY  2012   one polyp Clinical cytogeneticist)  . Introital revision  10/26/2015    Family History:  Family History  Problem Relation Age of Onset  . Hypertension Mother   . Diabetes Mother   . Sleep apnea Mother   . Hyperlipidemia Father   . Sleep apnea Father   . Colon cancer Father 69  . Heart attack Father   . Hypertension Father   . Healthy Sister   . Hypertension Brother   . Cancer Maternal Grandfather        lung cancer in his 55s  . Kidney disease Paternal Grandfather   . Breast cancer Paternal Grandmother 97  . Diabetes Paternal Aunt   . Ovarian cancer Paternal Aunt 96  . Lung cancer Other   . Pancreatic cancer Other   . Cancer Maternal Uncle 58       tongue  . Prostate cancer Paternal Uncle        45s  . Prostate cancer Paternal Uncle        11s    Social History:  Social History   Socioeconomic History  . Marital status: Married    Spouse name: Not on file  . Number of children: 1  . Years of education: Not on file  . Highest education level: Not on file  Occupational History  . Occupation: Pharmacist, hospital  Social Needs  . Financial resource strain: Not on file  . Food insecurity:    Worry: Not on file    Inability: Not on file  . Transportation needs:    Medical: Not on file     Non-medical: Not on file  Tobacco Use  . Smoking status: Never Smoker  . Smokeless tobacco: Never Used  Substance and Sexual Activity  . Alcohol use: Yes    Alcohol/week: 0.0 - 4.0 standard drinks  . Drug use: No  . Sexual activity: Not Currently    Birth control/protection: Post-menopausal  Lifestyle  . Physical activity:    Days per week: Not on file    Minutes per session: Not on file  . Stress: Not on file  Relationships  . Social connections:    Talks on phone: Not on file    Gets together: Not on file    Attends religious service: Not on file    Active member of club or organization: Not on file    Attends meetings of clubs or organizations:  Not on file    Relationship status: Not on file  . Intimate partner violence:    Fear of current or ex partner: Not on file    Emotionally abused: Not on file    Physically abused: Not on file    Forced sexual activity: Not on file  Other Topics Concern  . Not on file  Social History Narrative  . Not on file    Allergies:  Allergies  Allergen Reactions  . Indomethacin Other (See Comments)    severe H/As severe H/As    Medications:  Current Outpatient Medications on File Prior to Visit  Medication Sig Dispense Refill  . butalbital-acetaminophen-caffeine (FIORICET, ESGIC) 50-325-40 MG tablet Take 1 tablet by mouth 2 (two) times daily as needed for headache. 14 tablet 0  . cetirizine (ZYRTEC) 10 MG tablet Take 10 mg by mouth.    . Cholecalciferol (VITAMIN D3) 2000 UNITS capsule Take by mouth.    . Flaxseed, Linseed, (FLAX SEED OIL PO) Take 1,400 mg by mouth daily.    Marland Kitchen ibuprofen (ADVIL,MOTRIN) 600 MG tablet TAKE 1 TABLET(600 MG) BY MOUTH THREE TIMES DAILY AS NEEDED 90 tablet 4  . Magnesium 100 MG CAPS Take by mouth.    . Omega-3 Fatty Acids (FISH OIL PO) Take 2,000 mg by mouth 2 (two) times daily.    . Tofacitinib Citrate (XELJANZ) 5 MG TABS Xeljanz 5 mg tablet  TAKE ONE TABLET BY MOUTH TWICE DAILY. MAY BE TAKEN WITH OR  WITHOUT FOOD. STORE AT ROOM TEMPERATURE.    . vitamin B-12 (CYANOCOBALAMIN) 1000 MCG tablet Take 1,000 mcg by mouth daily.     No current facility-administered medications on file prior to visit.    And vitamin B12    Physical Exam Vitals: BP 132/84   Pulse (!) 105   Ht 5' 6"  (1.676 m)   Wt 212 lb (96.2 kg)   LMP 09/07/2015   BMI 34.22 kg/m   General: WF in NAD HEENT: normocephalic, anicteric Neck: no thyroid enlargement, no palpable nodules, no cervical lymphadenopathy  Pulmonary: No increased work of breathing, CTAB Cardiovascular: AP 104, regular rhythm, without murmur  Breast: Breast symmetrical, no tenderness, no palpable nodules or masses, no skin or nipple retraction present, no nipple discharge.  No axillary, infraclavicular or supraclavicular lymphadenopathy. Abdomen: Soft, mild tenderness to palpation on right side of abdomen, non-distended.  Umbilicus without lesions.  No hepatomegaly or masses palpable. No evidence of hernia. Genitourinary:  External: Inflamed vulva/ vestibule. Normal urethral meatus, normal Bartholin's and Skene's glands.    Vagina: tender, thick pink-brown tinged discharge, no evidence of prolapse.    Cervix: difficulty visualizing totally, pink, non-tender  Uterus: Anteverted to midplane, normal size, shape, and consistency, mobile, and non-tender  Adnexa: No adnexal masses, non-tender  Rectal: deferred  Lymphatic: no evidence of inguinal lymphadenopathy Extremities: no edema, erythema, or tenderness Neurologic: Grossly intact Psychiatric: mood appropriate, affect full BAck: negative CVAT  Results for orders placed or performed in visit on 11/09/18 (from the past 24 hour(s))  POCT Wet Prep [facility] [place])     Status: Abnormal   Collection Time: 11/10/18 11:40 AM  Result Value Ref Range   Source Wet Prep POC vagina    WBC, Wet Prep HPF POC     Bacteria Wet Prep HPF POC Many (A) Few   BACTERIA WET PREP MORPHOLOGY POC     Clue Cells Wet Prep HPF  POC Few (A) None   Clue Cells Wet Prep Whiff POC  Yeast Wet Prep HPF POC Moderate (A) None   KOH Wet Prep POC     Trichomonas Wet Prep HPF POC Absent Absent    Assessment: 47 y.o. postmenopausal gyn exam Bacterial vaginosis and monilial vulvovaginitis Decreased libido/ dyspareunia   Plan:   1) Breast cancer screening - recommend monthly self breast exam and annual screening mammogram. Mammogram UTD   2: RX for Tindamax 1 GM x 5 days and Diflucan 150 mgm x2 every 3 days x 2. Nystatin ointment+ Kenalog oint BID externally.  3) Cervical cancer screening - Pap was done.   4)Colon cancer screening: would like GI consult for colonoscopy.   5) Routine healthcare maintenance including cholesterol and diabetes screening managed by PCP   6) RTO in 1 year or sooner for persistent vaginitis symptoms. Dalia Heading, CNM

## 2018-11-10 ENCOUNTER — Encounter: Payer: Self-pay | Admitting: Certified Nurse Midwife

## 2018-11-10 ENCOUNTER — Other Ambulatory Visit (HOSPITAL_COMMUNITY)
Admission: RE | Admit: 2018-11-10 | Discharge: 2018-11-10 | Disposition: A | Discharge status: Home / Self Care | Payer: BLUE CROSS/BLUE SHIELD | Source: Ambulatory Visit | Attending: Certified Nurse Midwife | Admitting: Certified Nurse Midwife

## 2018-11-10 DIAGNOSIS — Z124 Encounter for screening for malignant neoplasm of cervix: Secondary | ICD-10-CM | POA: Insufficient documentation

## 2018-11-10 LAB — POCT WET PREP (WET MOUNT): Trichomonas Wet Prep HPF POC: ABSENT

## 2018-11-12 LAB — CYTOLOGY - PAP
Diagnosis: NEGATIVE
HPV: NOT DETECTED

## 2018-11-24 HISTORY — PX: OTHER SURGICAL HISTORY: SHX169

## 2019-02-14 ENCOUNTER — Other Ambulatory Visit: Payer: Self-pay | Admitting: Family Medicine

## 2019-02-14 DIAGNOSIS — K219 Gastro-esophageal reflux disease without esophagitis: Secondary | ICD-10-CM

## 2019-03-19 ENCOUNTER — Other Ambulatory Visit: Payer: Self-pay | Admitting: Family Medicine

## 2019-03-19 DIAGNOSIS — K219 Gastro-esophageal reflux disease without esophagitis: Secondary | ICD-10-CM

## 2019-03-31 ENCOUNTER — Telehealth: Payer: Self-pay

## 2019-03-31 NOTE — Telephone Encounter (Signed)
LMTCB about PHQ screening 

## 2019-08-08 ENCOUNTER — Telehealth: Payer: Self-pay | Admitting: Family Medicine

## 2019-08-08 ENCOUNTER — Other Ambulatory Visit: Payer: Self-pay | Admitting: *Deleted

## 2019-08-08 DIAGNOSIS — Z20822 Contact with and (suspected) exposure to covid-19: Secondary | ICD-10-CM

## 2019-08-08 DIAGNOSIS — Z20828 Contact with and (suspected) exposure to other viral communicable diseases: Secondary | ICD-10-CM

## 2019-08-08 NOTE — Telephone Encounter (Signed)
Pt was exposed to [place] last Tuesday. No new symptoms - has sore throat and headaches with allergies. Wants to know if she needs to be tested?  Please call pt a back to let her know.  Thanks, [employer]

## 2019-08-08 NOTE — Telephone Encounter (Signed)
Called patient and she states that she was in direct contact with a friend that tested positive for Covid on Friday. Patient is experiencing headaches, and sore throat. Order was placed and advised to self quarantine until test results are back.

## 2019-08-09 ENCOUNTER — Telehealth: Payer: Self-pay

## 2019-08-09 LAB — NOVEL CORONAVIRUS, NAA: SARS-CoV-2, NAA: NOT DETECTED

## 2019-08-09 NOTE — Telephone Encounter (Signed)
Patient called back stating that she had looked on "my chart" and seen her results of Covid test which was negative and there was no need for a CMA to return her call. cbe

## 2019-08-09 NOTE — Telephone Encounter (Signed)
-----   Message from Jerrol Banana., MD sent at 08/09/2019  7:59 AM EDT ----- Negative

## 2019-08-09 NOTE — Telephone Encounter (Signed)
Left message for patient to call back regarding test results.

## 2019-09-06 ENCOUNTER — Ambulatory Visit: Payer: Self-pay | Admitting: Family Medicine

## 2019-09-12 ENCOUNTER — Encounter: Payer: Self-pay | Admitting: Family Medicine

## 2019-09-12 ENCOUNTER — Other Ambulatory Visit: Payer: Self-pay

## 2019-09-12 ENCOUNTER — Ambulatory Visit: Payer: BC Managed Care – PPO | Admitting: Family Medicine

## 2019-09-12 VITALS — BP 125/80 | HR 105 | Temp 97.5°F | Resp 16 | Ht 66.0 in | Wt 222.0 lb

## 2019-09-12 DIAGNOSIS — E538 Deficiency of other specified B group vitamins: Secondary | ICD-10-CM | POA: Diagnosis not present

## 2019-09-12 DIAGNOSIS — I8393 Asymptomatic varicose veins of bilateral lower extremities: Secondary | ICD-10-CM | POA: Diagnosis not present

## 2019-09-12 DIAGNOSIS — F411 Generalized anxiety disorder: Secondary | ICD-10-CM | POA: Diagnosis not present

## 2019-09-12 DIAGNOSIS — M0609 Rheumatoid arthritis without rheumatoid factor, multiple sites: Secondary | ICD-10-CM | POA: Diagnosis not present

## 2019-09-12 DIAGNOSIS — G63 Polyneuropathy in diseases classified elsewhere: Secondary | ICD-10-CM

## 2019-09-12 NOTE — Progress Notes (Signed)
Patient: Carolyn Hays Female    DOB: 1970-12-19   48 y.o.   MRN: [id number] Visit Date: 09/12/2019  Today's Provider: Wilhemena Durie, MD   Chief Complaint  Patient presents with  . Hypertension   Subjective:     HPI   Patient is here concerning her blood pressure. Patient states she has been having headaches for 2 months. Patient states she has headaches about 3 times a week. Patient states she sees Dr. Brigitte Pulse for headaches. Patient has been checking her blood pressure at home reading are averaging 121/87. Patient believes headaches may be caused by elevated blood pressure.  Patient also had a sore on her scalp for 2 months that she wants provider to look at. Sore has gotten better now.     Allergies  Allergen Reactions  . Indomethacin Other (See Comments)    severe H/As severe H/As     Current Outpatient Medications:  .  butalbital-acetaminophen-caffeine (FIORICET, ESGIC) 50-325-40 MG tablet, Take 1 tablet by mouth 2 (two) times daily as needed for headache., Disp: 14 tablet, Rfl: 0 .  cetirizine (ZYRTEC) 10 MG tablet, Take 10 mg by mouth., Disp: , Rfl:  .  Cholecalciferol (VITAMIN D3) 2000 UNITS capsule, Take by mouth., Disp: , Rfl:  .  ibuprofen (ADVIL,MOTRIN) 600 MG tablet, TAKE 1 TABLET(600 MG) BY MOUTH THREE TIMES DAILY AS NEEDED, Disp: 90 tablet, Rfl: 4 .  Magnesium 100 MG CAPS, Take by mouth., Disp: , Rfl:  .  Multiple Vitamins-Minerals (ZINC PO), Take by mouth., Disp: , Rfl:  .  Omega-3 Fatty Acids (FISH OIL PO), Take 2,000 mg by mouth 2 (two) times daily., Disp: , Rfl:  .  omeprazole (PRILOSEC) 20 MG capsule, Take 20 mg by mouth daily., Disp: , Rfl:  .  Tofacitinib Citrate (XELJANZ) 5 MG TABS, Xeljanz 5 mg tablet  TAKE ONE TABLET BY MOUTH TWICE DAILY. MAY BE TAKEN WITH OR WITHOUT FOOD. STORE AT ROOM TEMPERATURE., Disp: , Rfl:  .  vitamin B-12 (CYANOCOBALAMIN) 1000 MCG tablet, Take 1,000 mcg by mouth daily., Disp: , Rfl:  .  Flaxseed, Linseed, (FLAX  SEED OIL PO), Take 1,400 mg by mouth daily., Disp: , Rfl:  .  fluconazole (DIFLUCAN) 150 MG tablet, Take one tablet every 3 days x 2 doses (Patient not taking: Reported on 09/12/2019), Disp: 2 tablet, Rfl: 0 .  nystatin ointment (MYCOSTATIN), Apply 1 application topically 2 (two) times daily. (Patient not taking: Reported on 09/12/2019), Disp: 30 g, Rfl: 0 .  ranitidine (ZANTAC) 150 MG tablet, TAKE 1 TABLET(150 MG) BY MOUTH TWICE DAILY (Patient not taking: Reported on 09/12/2019), Disp: 180 tablet, Rfl: 0 .  triamcinolone ointment (KENALOG) 0.1 %, Apply 1 application topically 2 (two) times daily. (Patient not taking: Reported on 09/12/2019), Disp: 30 g, Rfl: 0  Review of Systems  Constitutional: Negative for appetite change, chills, fatigue and fever.  HENT: Negative.   Eyes: Negative.   Respiratory: Negative for chest tightness and shortness of breath.   Cardiovascular: Negative for chest pain and palpitations.  Gastrointestinal: Negative for abdominal pain, nausea and vomiting.  Endocrine: Negative.   Genitourinary: Negative.   Allergic/Immunologic: Negative.   Neurological: Negative for dizziness and weakness.  Psychiatric/Behavioral: Negative.     Social History   Tobacco Use  . Smoking status: Never Smoker  . Smokeless tobacco: Never Used  Substance Use Topics  . Alcohol use: Yes    Alcohol/week: 0.0 - 4.0 standard drinks  Objective:   BP 125/80 (BP Location: Right Arm, Patient Position: Sitting, Cuff Size: Large)   Pulse (!) 105   Temp (!) 97.5 F (36.4 C) (Other (Comment))   Resp 16   Ht 5\' 6"  (1.676 m)   Wt 222 lb (100.7 kg)   LMP 09/07/2015   SpO2 98%   BMI 35.83 kg/m  Vitals:   09/12/19 1553  BP: 125/80  Pulse: (!) 105  Resp: 16  Temp: (!) 97.5 F (36.4 C)  TempSrc: Other (Comment)  SpO2: 98%  Weight: 222 lb (100.7 kg)  Height: 5\' 6"  (1.676 m)  Body mass index is 35.83 kg/m.   Physical Exam Vitals signs reviewed.  Constitutional:       Appearance: She is well-developed.  HENT:     Head: Normocephalic and atraumatic.  Eyes:     General: No scleral icterus. Neck:     Thyroid: No thyromegaly.  Cardiovascular:     Rate and Rhythm: Normal rate and regular rhythm.     Heart sounds: Normal heart sounds.  Pulmonary:     Effort: Pulmonary effort is normal.     Breath sounds: Normal breath sounds.  Abdominal:     Palpations: Abdomen is soft.  Skin:    General: Skin is warm and dry.     Comments: Scalp without any lesions.  Neurological:     Mental Status: She is alert and oriented to person, place, and time.  Psychiatric:        Mood and Affect: Mood normal.        Behavior: Behavior normal.        Thought Content: Thought content normal.        Judgment: Judgment normal.      No results found for any visits on 09/12/19.     Assessment & Plan    1. Rheumatoid arthritis of multiple sites with negative rheumatoid factor [facility]) Per Rheumatology.  2. Anxiety, generalized Off of Wellbutrin--doing well--recommend EAP. Ongoing issue. 3. B12 neuropathy (HCC)   4. Asymptomatic varicose veins of both lower extremities Recently had procedure with Dr 09/14/19.  Follow up in February for CPE.       Guss Bunde, MD  [facility]

## 2019-09-29 ENCOUNTER — Other Ambulatory Visit: Payer: Self-pay | Admitting: Certified Nurse Midwife

## 2019-09-29 DIAGNOSIS — Z1231 Encounter for screening mammogram for malignant neoplasm of breast: Secondary | ICD-10-CM

## 2019-10-13 ENCOUNTER — Other Ambulatory Visit: Payer: Self-pay

## 2019-10-13 ENCOUNTER — Ambulatory Visit
Admission: RE | Admit: 2019-10-13 | Discharge: 2019-10-13 | Disposition: A | Discharge status: Home / Self Care | Payer: BC Managed Care – PPO | Source: Ambulatory Visit | Attending: Certified Nurse Midwife | Admitting: Certified Nurse Midwife

## 2019-10-13 DIAGNOSIS — Z1231 Encounter for screening mammogram for malignant neoplasm of breast: Secondary | ICD-10-CM | POA: Diagnosis not present

## 2019-10-24 ENCOUNTER — Other Ambulatory Visit: Payer: Self-pay

## 2019-10-24 DIAGNOSIS — Z20822 Contact with and (suspected) exposure to covid-19: Secondary | ICD-10-CM

## 2019-10-26 LAB — NOVEL CORONAVIRUS, NAA: SARS-CoV-2, NAA: NOT DETECTED

## 2019-11-03 NOTE — Progress Notes (Signed)
Carolyn Hays  MRN: [id number] DOB: [birth year]  Subjective:  Hand Pain  There was no injury mechanism. The pain is present in the right hand and left hand. The quality of the pain is described as aching and cramping. The pain radiates to the left arm. The pain is at a severity of 6/10. The pain is mild. The pain has been intermittent since the incident. The symptoms are aggravated by movement. She has tried rest for the symptoms. The treatment provided mild relief.     The patient is a 48 year old female who presents for evaluation of sore and stiff hands.    Patient was tested for Covid on 10/24/2019 after having an exposure at work.  Her results were negative, she has had no symptoms suggestive of Covid and it has been 10+ days since the exposure and testing.   Patient Active Problem List   Diagnosis Date Noted  . Greater trochanteric pain syndrome 11/04/2019  . Hypercholesterolemia 11/04/2019  . Insomnia 11/04/2019  . Varicose veins of lower extremity 11/04/2019  . Decreased libido 08/21/2017  . Alopecia 09/11/2015  . Carpal tunnel syndrome 09/11/2015  . Rheumatoid arthritis ([place]) 09/11/2015  . Dermatitis, eczematoid 09/11/2015  . Acid reflux 09/11/2015  . Anxiety, generalized 09/11/2015  . 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 Avitaminosis D 09/11/2015  . Disorder of joint of spine 03/01/2014  . Inflammatory spondylopathy ([place]) 03/01/2014  . Allergic rhinitis 09/11/2009  . Cervicogenic headache 09/11/2009  . Irritable bowel syndrome 09/11/2009    Past Medical History:  Diagnosis Date  . Allergic rhinitis   . Alopecia   . Anxiety   . Arthritis   . Calculus of kidney   . Carpal tunnel syndrome   . Depression   . Dyspareunia, female   . Eczema   . Family history of breast cancer    Neg BRCA/BART 2014; Neg MyRisk update 10/18; riskscore=19.2%/IBIS=19.1%  . Family history of ovarian cancer    paternal aunt in her 11s  . GERD (gastroesophageal reflux disease)   .  IBS (irritable bowel syndrome)   . Vitamin B12 deficiency neuropathy ([place])   . Vitamin D deficiency    Past Surgical History:  Procedure Laterality Date  . [place] GLAND CYST EXCISION  2007  . CESAREAN SECTION  10/27/2010  . CHOLECYSTECTOMY  10/2006  . COLONOSCOPY  2012   one polyp Clinical cytogeneticist)  . Introital revision  10/26/2015   Family History  Problem Relation Age of Onset  . Hypertension Mother   . Diabetes Mother   . Sleep apnea Mother   . Hyperlipidemia Father   . Sleep apnea Father   . Colon cancer Father 23  . Heart attack Father   . Hypertension Father   . Healthy Sister   . Hypertension Brother   . Cancer Maternal Grandfather        lung cancer in his 69s  . Kidney disease Paternal Grandfather   . Breast cancer Paternal Grandmother 50  . Diabetes Paternal Aunt   . Ovarian cancer Paternal Aunt 77  . Lung cancer Other   . Pancreatic cancer Other   . Cancer Maternal Uncle 58       tongue  . Prostate cancer Paternal Uncle        61s  . Prostate cancer Paternal Uncle        39s   Social History   Socioeconomic History  . Marital status: Married    Spouse name: Not on file  .  Number of children: 1  . Years of education: Not on file  . Highest education level: Not on file  Occupational History  . Occupation: Pharmacist, hospital  Tobacco Use  . Smoking status: Never Smoker  . Smokeless tobacco: Never Used  Substance and Sexual Activity  . Alcohol use: Yes    Alcohol/week: 0.0 - 4.0 standard drinks  . Drug use: No  . Sexual activity: Not Currently    Birth control/protection: Post-menopausal  Other Topics Concern  . Not on file  Social History Narrative  . Not on file   Social Determinants of Health   Financial Resource Strain:   . Difficulty of Paying Living Expenses: Not on file  Food Insecurity:   . Worried About Charity fundraiser in the Last Year: Not on file  . Ran Out of Food in the Last Year: Not on file  Transportation Needs:   . Lack of  Transportation (Medical): Not on file  . Lack of Transportation (Non-Medical): Not on file  Physical Activity:   . Days of Exercise per Week: Not on file  . Minutes of Exercise per Session: Not on file  Stress:   . Feeling of Stress : Not on file  Social Connections:   . Frequency of Communication with Friends and Family: Not on file  . Frequency of Social Gatherings with Friends and Family: Not on file  . Attends Religious Services: Not on file  . Active Member of Clubs or Organizations: Not on file  . Attends Archivist Meetings: Not on file  . Marital Status: Not on file  Intimate Partner Violence:   . Fear of Current or Ex-Partner: Not on file  . Emotionally Abused: Not on file  . Physically Abused: Not on file  . Sexually Abused: Not on file    Outpatient Encounter Medications as of 11/04/2019  Medication Sig Note  . butalbital-acetaminophen-caffeine (FIORICET, ESGIC) 50-325-40 MG tablet Take 1 tablet by mouth 2 (two) times daily as needed for headache.   . cetirizine (ZYRTEC) 10 MG tablet Take 10 mg by mouth. 09/11/2015: Received from: [facility]  . Cholecalciferol (VITAMIN D3) 2000 UNITS capsule Take by mouth. 09/11/2015: Received from: [facility]  . cyclobenzaprine (FLEXERIL) 10 MG tablet SMARTSIG:1-2 Tablet(s) By Mouth As Directed   . Fish Oil-Cholecalciferol (OMEGA-3 FISH OIL/VITAMIN D3 PO) Take by mouth.   . Flaxseed, Linseed, (FLAX SEED OIL PO) Take 1,400 mg by mouth daily.   Marland Kitchen ibuprofen (ADVIL,MOTRIN) 600 MG tablet TAKE 1 TABLET(600 MG) BY MOUTH THREE TIMES DAILY AS NEEDED   . Magnesium 100 MG CAPS Take by mouth.   . magnesium oxide (MAG-OX) 400 MG tablet magnesium oxide 400 mg (241.3 mg magnesium) tablet  TK 1 T  PO QD   . Multiple Vitamins-Minerals (ZINC PO) Take by mouth.   . Omega-3 Fatty Acids (FISH OIL PO) Take 2,000 mg by mouth 2 (two) times daily.   Marland Kitchen omeprazole (PRILOSEC) 20 MG capsule Take 20 mg by mouth daily.   . Tofacitinib Citrate  (XELJANZ) 5 MG TABS Xeljanz 5 mg tablet  TAKE ONE TABLET BY MOUTH TWICE DAILY. MAY BE TAKEN WITH OR WITHOUT FOOD. STORE AT ROOM TEMPERATURE.   . vitamin B-12 (CYANOCOBALAMIN) 1000 MCG tablet Take 1,000 mcg by mouth daily.   . [DISCONTINUED] ALPRAZolam (XANAX) 0.5 MG tablet BRING MEDICATION TO THE OFFICE ON THE DAY OF THE PROCEDURE TO BE DISPENSED BY THE MEDICAL STAFF   . [DISCONTINUED] fluconazole (DIFLUCAN) 150 MG tablet  Take one tablet every 3 days x 2 doses (Patient not taking: Reported on 09/12/2019)   . [DISCONTINUED] nystatin ointment (MYCOSTATIN) Apply 1 application topically 2 (two) times daily. (Patient not taking: Reported on 09/12/2019)   . [DISCONTINUED] ranitidine (ZANTAC) 150 MG tablet TAKE 1 TABLET(150 MG) BY MOUTH TWICE DAILY (Patient not taking: Reported on 09/12/2019)   . [DISCONTINUED] triamcinolone ointment (KENALOG) 0.1 % Apply 1 application topically 2 (two) times daily. (Patient not taking: Reported on 09/12/2019)    No facility-administered encounter medications on file as of 11/04/2019.    Allergies  Allergen Reactions  . Indomethacin Other (See Comments)    severe H/As severe H/As    Review of Systems  Constitutional: Negative.   HENT: Negative.   Eyes: Negative.   Respiratory: Negative.   Cardiovascular: Negative.   Gastrointestinal: Negative.   Genitourinary: Negative.   Musculoskeletal: Positive for joint pain and myalgias.  Skin: Negative.   Neurological: Negative.   Endo/Heme/Allergies: Negative.   Psychiatric/Behavioral: Negative.     Objective:  BP 119/76   Pulse 88   Temp 97.7 F (36.5 C) (Temporal)   Resp 18   Ht 5' 6" (1.676 m)   Wt 216 lb 9.6 oz (98.2 kg)   LMP 09/07/2015   SpO2 99%   BMI 34.96 kg/m   Physical Exam  Constitutional: She is oriented to person, place, and time and well-developed, well-nourished, and in no distress.  HENT:  Head: Normocephalic.  Eyes: Conjunctivae are normal.  Pulmonary/Chest: Effort normal.    Abdominal: Soft.  Musculoskeletal:        General: Tenderness present. Normal range of motion.     Cervical back: Neck supple.     Comments: Pains in the left hand to test grip. Some pain ulnar side of lateral wrist/hand on the right. Mild Heberden's nodes on a few DIP joints.  Neurological: She is alert and oriented to person, place, and time.  Skin: No rash noted.  Psychiatric: Mood, affect and judgment normal.    Assessment and Plan :   1. Pain in both hands Onset a couple weeks ago. Notice it when squeezing water from a wash cloth. Suspect flare of RA. Will add a short prednisone taper and may apply moist heat prn. - predniSONE (DELTASONE) 5 MG tablet; Taper down by one tablet by mouth daily starting at 6 day 1, 5 day 2, 4 day 3, 3 day 4, 2 day 5 and 1 day 6. Divide doses among meals and bedtime each day.  Dispense: 21 tablet; Refill: 0  2. Rheumatoid arthritis of multiple sites with negative rheumatoid factor (HCC) Followed by Dr. Sunday Shams (rheumatologist at [facility]) and presently on Xeljanz 5 mg BID. States this has helped to control symptoms well until this most recent episode of pains in her hands. Has a history of CTS controlled by using wrist splints at night. Not much help from topical treatments such as Aspercreme or Voltaren Gel. Will give a short burst of steroid and recommend she follow up with Dr. Sunday Shams. - predniSONE (DELTASONE) 5 MG tablet; Taper down by one tablet by mouth daily starting at 6 day 1, 5 day 2, 4 day 3, 3 day 4, 2 day 5 and 1 day 6. Divide doses among meals and bedtime each day.  Dispense: 21 tablet; Refill: 0

## 2019-11-04 ENCOUNTER — Encounter: Payer: Self-pay | Admitting: Family Medicine

## 2019-11-04 ENCOUNTER — Other Ambulatory Visit: Payer: Self-pay

## 2019-11-04 ENCOUNTER — Ambulatory Visit: Payer: BC Managed Care – PPO | Admitting: Family Medicine

## 2019-11-04 VITALS — BP 119/76 | HR 88 | Temp 97.7°F | Resp 18 | Ht 66.0 in | Wt 216.6 lb

## 2019-11-04 DIAGNOSIS — M25559 Pain in unspecified hip: Secondary | ICD-10-CM | POA: Insufficient documentation

## 2019-11-04 DIAGNOSIS — M79641 Pain in right hand: Secondary | ICD-10-CM

## 2019-11-04 DIAGNOSIS — M79642 Pain in left hand: Secondary | ICD-10-CM | POA: Diagnosis not present

## 2019-11-04 DIAGNOSIS — I8390 Asymptomatic varicose veins of unspecified lower extremity: Secondary | ICD-10-CM | POA: Insufficient documentation

## 2019-11-04 DIAGNOSIS — M0609 Rheumatoid arthritis without rheumatoid factor, multiple sites: Secondary | ICD-10-CM | POA: Diagnosis not present

## 2019-11-04 DIAGNOSIS — G4700 Insomnia, unspecified: Secondary | ICD-10-CM | POA: Insufficient documentation

## 2019-11-04 DIAGNOSIS — E7800 Pure hypercholesterolemia, unspecified: Secondary | ICD-10-CM | POA: Insufficient documentation

## 2019-11-04 MED ORDER — PREDNISONE 5 MG PO TABS: ORAL_TABLET | ORAL | 0 refills | Status: DC

## 2019-11-10 NOTE — Progress Notes (Signed)
Gynecology Annual Exam  PCP: Jerrol Banana., MD  Chief Complaint:  Chief Complaint  Patient presents with  . Gynecologic Exam    History of Present Illness:Carolyn Hays is a 48 year old Caucasian/White female, G1 P1001, who presents for her gyn exam. She is not having any significant gyn problems.  She has not had any vaginal bleeding since her last visit. She denies any significant hot flashes. She is not sexually active.She has a history of  painful intercourse and was referred to [facility] urogynecology. Her pain resolved after she had a introital revision surgery 10/26/2015. When she no longer had painful intercourse, she still had no sex drive.Was treated with Activella without benefit and she stopped taking the HT.  Since her last annual exam 11/10/2018,. She has been getting electrolysis treatments and would like a note for her technician to allow electrolysis of the hairs in her mole on her face. She has also had sclerotherapy on her varicose veins on her legs.   The patient's past medical history is notable for a history of inflammatory arthritis.   She also has a history of depression/anxiety. She used to take Wellbutrin, but weaned off this medication 2 years ago.   Her most recent pap smear was obtained 11/10/2018 and was NIL with negative HRHPV. Her most recent mammogram obtained on 10/13/19 was normal.  There is a positive history of breast cancer in her paternal grandmother and ovarian cancer in her paternal aunt. Genetic testing has been done. The patient tested negative for BRCA 1, negative for BRCA 2, negative for BART, and 2 years ago she had a [place] update which was negative. Her lifetime risk of breast cancer is 19.2% Colon cancer screening: colonoscopy 2012 (one polyp)  Had colon cancer but father has colon cancer; scheduled colonoscopy for 11/28/2019 The patient does not do monthly self breast exams.  The patient does not smoke.  The patient does drink  alcohol occasionally The patient does not use illegal drugs.  The patient exercises by walking most days since June (2-2.5 miles) The patient does get adequate calcium in her diet.  She had a recent cholesterol screen in 2020 which was borderline and is being followed every 6 months since restarting Xeljanz    Review of Systems: Review of Systems  Constitutional: Negative for chills, fever, malaise/fatigue and weight loss.  HENT: Negative for congestion, sinus pain and sore throat.   Eyes: Negative for blurred vision and pain.  Respiratory: Negative for hemoptysis, shortness of breath and wheezing.   Cardiovascular: Negative for chest pain, palpitations and leg swelling.  Gastrointestinal: Negative for blood in stool, diarrhea, heartburn, nausea and vomiting.  Genitourinary: Negative for dysuria, frequency, hematuria and urgency.       Positive for amenorrhea and painful intercourse  Musculoskeletal: Negative for back pain, joint pain and myalgias.  Skin: Negative for itching and rash.  Neurological: Negative for dizziness, tingling and headaches.  Endo/Heme/Allergies: Negative for environmental allergies and polydipsia. Does not bruise/bleed easily.       Positive for hirsutism and decreased libido.   Psychiatric/Behavioral: Negative for depression. The patient is not nervous/anxious and does not have insomnia.     Past Medical History:  Past Medical History:  Diagnosis Date  . Allergic rhinitis   . Alopecia   . Anxiety   . Arthritis   . Calculus of kidney   . Carpal tunnel syndrome   . Depression   . Dyspareunia, female   .  Eczema   . Family history of breast cancer    Neg BRCA/BART 2014; Neg MyRisk update 10/18; riskscore=19.2%/IBIS=19.1%  . Family history of ovarian cancer    paternal aunt in her 65s  . GERD (gastroesophageal reflux disease)   . IBS (irritable bowel syndrome)   . Vitamin B12 deficiency neuropathy ([place])   . Vitamin D deficiency     Past Surgical  History:  Past Surgical History:  Procedure Laterality Date  . [place] GLAND CYST EXCISION  2007  . CESAREAN SECTION  10/27/2010  . CHOLECYSTECTOMY  10/2006  . COLONOSCOPY  2012   one polyp Clinical cytogeneticist)  . Introital revision  10/26/2015  . OTHER SURGICAL HISTORY  2020   BILATERAL VERICOSE VEIN TREATMENT VIA LASER.    Family History:  Family History  Problem Relation Age of Onset  . Hypertension Mother   . Diabetes Mother   . Sleep apnea Mother   . Hyperlipidemia Father   . Sleep apnea Father   . Colon cancer Father 31  . Heart attack Father   . Hypertension Father   . Healthy Sister   . Hypertension Brother   . Cancer Maternal Grandfather        lung cancer in his 67s  . Kidney disease Paternal Grandfather   . Breast cancer Paternal Grandmother 86  . Diabetes Paternal Aunt   . Ovarian cancer Paternal Aunt 35  . Lung cancer Other   . Pancreatic cancer Other   . Cancer Maternal Uncle 58       tongue  . Prostate cancer Paternal Uncle        55s  . Prostate cancer Paternal Uncle        69s    Social History:  Social History   Socioeconomic History  . Marital status: Married    Spouse name: Not on file  . Number of children: 1  . Years of education: Not on file  . Highest education level: Not on file  Occupational History  . Occupation: Pharmacist, hospital  Tobacco Use  . Smoking status: Never Smoker  . Smokeless tobacco: Never Used  Substance and Sexual Activity  . Alcohol use: Yes    Alcohol/week: 0.0 - 4.0 standard drinks  . Drug use: No  . Sexual activity: Not Currently    Birth control/protection: Post-menopausal  Other Topics Concern  . Not on file  Social History Narrative  . Not on file   Social Determinants of Health   Financial Resource Strain:   . Difficulty of Paying Living Expenses: Not on file  Food Insecurity:   . Worried About Charity fundraiser in the Last Year: Not on file  . Ran Out of Food in the Last Year: Not on file    Transportation Needs:   . Lack of Transportation (Medical): Not on file  . Lack of Transportation (Non-Medical): Not on file  Physical Activity:   . Days of Exercise per Week: Not on file  . Minutes of Exercise per Session: Not on file  Stress:   . Feeling of Stress : Not on file  Social Connections:   . Frequency of Communication with Friends and Family: Not on file  . Frequency of Social Gatherings with Friends and Family: Not on file  . Attends Religious Services: Not on file  . Active Member of Clubs or Organizations: Not on file  . Attends Archivist Meetings: Not on file  . Marital Status: Not on file  Intimate Partner Violence:   .  Fear of Current or Ex-Partner: Not on file  . Emotionally Abused: Not on file  . Physically Abused: Not on file  . Sexually Abused: Not on file    Allergies:  Allergies  Allergen Reactions  . Indomethacin Other (See Comments)    severe H/As severe H/As    Medications:  Current Outpatient Medications on File Prior to Visit  Medication Sig Dispense Refill  . butalbital-acetaminophen-caffeine (FIORICET, ESGIC) 50-325-40 MG tablet Take 1 tablet by mouth 2 (two) times daily as needed for headache. 14 tablet 0  . cetirizine (ZYRTEC) 10 MG tablet Take 10 mg by mouth.    . Cholecalciferol (VITAMIN D3) 2000 UNITS capsule Take by mouth.    . cyclobenzaprine (FLEXERIL) 10 MG tablet SMARTSIG:1-2 Tablet(s) By Mouth As Directed    . Fish Oil-Cholecalciferol (OMEGA-3 FISH OIL/VITAMIN D3 PO) Take by mouth.    Marland Kitchen ibuprofen (ADVIL,MOTRIN) 600 MG tablet TAKE 1 TABLET(600 MG) BY MOUTH THREE TIMES DAILY AS NEEDED 90 tablet 4  . Magnesium 100 MG CAPS Take by mouth.    . Omega-3 Fatty Acids (FISH OIL PO) Take 2,000 mg by mouth 2 (two) times daily.    Marland Kitchen omeprazole (PRILOSEC) 20 MG capsule Take 20 mg by mouth daily.    . Tofacitinib Citrate (XELJANZ) 5 MG TABS Xeljanz 5 mg tablet  TAKE ONE TABLET BY MOUTH TWICE DAILY. MAY BE TAKEN WITH OR WITHOUT FOOD.  STORE AT ROOM TEMPERATURE.    . vitamin B-12 (CYANOCOBALAMIN) 1000 MCG tablet Take 1,000 mcg by mouth daily.    . Flaxseed, Linseed, (FLAX SEED OIL PO) Take 1,400 mg by mouth daily.    . Multiple Vitamins-Minerals (ZINC PO) Take by mouth.    . predniSONE (DELTASONE) 5 MG tablet Taper down by one tablet by mouth daily starting at 6 day 1, 5 day 2, 4 day 3, 3 day 4, 2 day 5 and 1 day 6. Divide doses among meals and bedtime each day. 21 tablet 0   No current facility-administered medications on file prior to visit.   And vitamin B12    Physical Exam Vitals: BP 120/82   Pulse (!) 102   Temp (!) 97 F (36.1 C)   Ht _0  (1.676 m)   Wt 219 lb (99.3 kg)   LMP 09/07/2015   BMI 35.35 kg/m   General: WF in NAD HEENT: normocephalic, anicteric Neck: no thyroid enlargement, no palpable nodules, no cervical lymphadenopathy  Pulmonary: No increased work of breathing, CTAB Cardiovascular:Mild tachycardia, regular rhythm, without murmur  Breast: Breast symmetrical, no tenderness, no palpable nodules or masses, no skin or nipple retraction present, no nipple discharge.  No axillary, infraclavicular or supraclavicular lymphadenopathy. Abdomen: Soft, mild tenderness to palpation on right side of abdomen (chronic), non-distended.  Umbilicus without lesions.  No hepatomegaly or masses palpable. No evidence of hernia. Genitourinary:  External: Atrophic changes Normal urethral meatus, normal Bartholin's and Skene's glands.    Vagina: tender introitus with pelvic exam,  no evidence of prolapse.    Cervix: closed, NT  Uterus: Anteverted to midplane, normal size, shape, and consistency, mobile, and non-tender  Adnexa: No adnexal masses, non-tender  Rectal: deferred  Lymphatic: no evidence of inguinal lymphadenopathy Extremities: no edema, erythema, or tenderness Neurologic: Grossly intact Psychiatric: mood appropriate, affect full   Assessment: 48 y.o. postmenopausal gyn exam Decreased libido/  dyspareunia   Plan:   1) Breast cancer screening - recommend monthly self breast exam and annual screening mammogram. Mammogram UTD   2: Does not  desire further treatment of decreased libido or dyspareunia  3) Cervical cancer screening - Pap was not done. Desires Pap smears every 3 years  4)Colon cancer screening: colonoscopy scheduled for 11/28/2019  5) Routine healthcare maintenance including cholesterol and diabetes screening managed by PCP   6) RTO in 1 year or sooner prn Dalia Heading, CNM

## 2019-11-11 ENCOUNTER — Encounter: Payer: Self-pay | Admitting: Certified Nurse Midwife

## 2019-11-11 ENCOUNTER — Ambulatory Visit (INDEPENDENT_AMBULATORY_CARE_PROVIDER_SITE_OTHER): Payer: BC Managed Care – PPO | Admitting: Certified Nurse Midwife

## 2019-11-11 ENCOUNTER — Other Ambulatory Visit: Payer: Self-pay

## 2019-11-11 VITALS — BP 120/82 | HR 102 | Temp 97.0°F | Ht 66.0 in | Wt 219.0 lb

## 2019-11-11 DIAGNOSIS — Z01419 Encounter for gynecological examination (general) (routine) without abnormal findings: Secondary | ICD-10-CM | POA: Diagnosis not present

## 2019-11-27 ENCOUNTER — Encounter: Payer: Self-pay | Admitting: Family Medicine

## 2020-01-03 ENCOUNTER — Encounter: Payer: Self-pay | Admitting: Family Medicine

## 2020-01-21 ENCOUNTER — Ambulatory Visit: Payer: BC Managed Care – PPO

## 2020-02-15 ENCOUNTER — Ambulatory Visit: Payer: BC Managed Care – PPO

## 2020-02-15 ENCOUNTER — Ambulatory Visit: Payer: BC Managed Care – PPO | Attending: Internal Medicine

## 2020-02-15 DIAGNOSIS — Z23 Encounter for immunization: Secondary | ICD-10-CM

## 2020-02-15 NOTE — Progress Notes (Signed)
   Covid-19 Vaccination Clinic  Name:  Carolyn Hays    MRN: [id number] DOB: [birth year]  02/15/2020  [name] was observed post Covid-19 immunization for 15 minutes without incident. She was provided with Vaccine Information Sheet and instruction to access the V-Safe system.   [name] was instructed to call 911 with any severe reactions post vaccine: Marland Kitchen Difficulty breathing  . Swelling of face and throat  . A fast heartbeat  . A bad rash all over body  . Dizziness and weakness   Immunizations Administered    Name Date Dose VIS Date Route   Pfizer COVID-19 Vaccine 02/15/2020  4:31 PM 0.3 mL 11/04/2019 Intramuscular   Manufacturer: [employer], [employer]   Lot: [id number]   NDC: [address]-8

## 2020-02-16 ENCOUNTER — Ambulatory Visit: Payer: BC Managed Care – PPO

## 2021-10-01 ENCOUNTER — Ambulatory Visit (INDEPENDENT_AMBULATORY_CARE_PROVIDER_SITE_OTHER): Payer: BC Managed Care – PPO | Admitting: Family Medicine

## 2021-10-01 ENCOUNTER — Other Ambulatory Visit: Payer: Self-pay

## 2021-10-01 VITALS — BP 133/90 | HR 88 | Temp 98.1°F | Wt 216.0 lb

## 2021-10-01 DIAGNOSIS — E785 Hyperlipidemia, unspecified: Secondary | ICD-10-CM

## 2021-10-01 DIAGNOSIS — R7303 Prediabetes: Secondary | ICD-10-CM

## 2021-10-01 DIAGNOSIS — K760 Fatty (change of) liver, not elsewhere classified: Secondary | ICD-10-CM | POA: Diagnosis not present

## 2021-10-01 MED ORDER — ROSUVASTATIN CALCIUM 10 MG PO TABS: 10.0000 mg | ORAL_TABLET | Freq: Every day | ORAL | 3 refills | Status: DC

## 2021-10-01 NOTE — Progress Notes (Signed)
Established patient visit   Patient: Carolyn Hays   DOB: 1971-10-11   50 y.o. Female  MRN: [id number] Visit Date: 10/01/2021  Today's healthcare provider: Megan Mans, MD   No chief complaint on file.  Subjective    HPI  Patient is a 50 year old female who presents today to get treatment for elevated cholesterol.  Patient states her rheumatologist had recently done labs and her cholesterol had gone up. Trista than seeing a dietitian as she had a brother with heart disease in his 69s and her father had heart disease in his 59s. He is amenable to treatment also with statin.    Medications: Outpatient Medications Prior to Visit  Medication Sig   butalbital-acetaminophen-caffeine (FIORICET, ESGIC) 50-325-40 MG tablet Take 1 tablet by mouth 2 (two) times daily as needed for headache.   cetirizine (ZYRTEC) 10 MG tablet Take 10 mg by mouth.   Cholecalciferol (VITAMIN D3) 2000 UNITS capsule Take by mouth.   Fish Oil-Cholecalciferol (OMEGA-3 FISH OIL/VITAMIN D3 PO) Take by mouth.   ibuprofen (ADVIL,MOTRIN) 600 MG tablet TAKE 1 TABLET(600 MG) BY MOUTH THREE TIMES DAILY AS NEEDED   Magnesium 100 MG CAPS Take by mouth.   Omega-3 Fatty Acids (FISH OIL PO) Take 2,000 mg by mouth 2 (two) times daily.   omeprazole (PRILOSEC) 20 MG capsule Take 20 mg by mouth daily.   predniSONE (DELTASONE) 5 MG tablet Taper down by one tablet by mouth daily starting at 6 day 1, 5 day 2, 4 day 3, 3 day 4, 2 day 5 and 1 day 6. Divide doses among meals and bedtime each day.   Tofacitinib Citrate 5 MG TABS Xeljanz 5 mg tablet  TAKE ONE TABLET BY MOUTH TWICE DAILY. MAY BE TAKEN WITH OR WITHOUT FOOD. STORE AT ROOM TEMPERATURE.   vitamin B-12 (CYANOCOBALAMIN) 1000 MCG tablet Take 1,000 mcg by mouth daily.   Zinc 100 MG TABS Take by mouth.   [DISCONTINUED] cyclobenzaprine (FLEXERIL) 10 MG tablet SMARTSIG:1-2 Tablet(s) By Mouth As Directed   [DISCONTINUED] Flaxseed, Linseed, (FLAX SEED OIL PO) Take 1,400  mg by mouth daily.   [DISCONTINUED] Multiple Vitamins-Minerals (ZINC PO) Take by mouth.   No facility-administered medications prior to visit.    Review of Systems  Constitutional:  Negative for appetite change, chills, fatigue and fever.  Respiratory:  Negative for chest tightness and shortness of breath.   Cardiovascular:  Negative for chest pain and palpitations.  Gastrointestinal:  Negative for abdominal pain, nausea and vomiting.  Neurological:  Negative for dizziness and weakness.       Objective    BP 133/90 (BP Location: Right Arm, Patient Position: Sitting, Cuff Size: Large)   Pulse 88   Temp 98.1 F (36.7 C) (Oral)   Wt 216 lb (98 kg)   LMP 09/07/2015   SpO2 100%   BMI 34.86 kg/m  BP Readings from Last 3 Encounters:  10/01/21 133/90  11/11/19 120/82  11/04/19 119/76   Wt Readings from Last 3 Encounters:  10/01/21 216 lb (98 kg)  11/11/19 219 lb (99.3 kg)  11/04/19 216 lb 9.6 oz (98.2 kg)      Physical Exam Vitals reviewed.  Constitutional:      Appearance: She is well-developed.  Eyes:     Conjunctiva/sclera: Conjunctivae normal.     Pupils: Pupils are equal, round, and reactive to light.  Cardiovascular:     Rate and Rhythm: Normal rate and regular rhythm.     Heart sounds:  Normal heart sounds.  Pulmonary:     Effort: Pulmonary effort is normal.     Breath sounds: Normal breath sounds.  Musculoskeletal:     Cervical back: Normal range of motion and neck supple.  Skin:    General: Skin is warm and dry.  Neurological:     General: No focal deficit present.     Mental Status: She is alert and oriented to person, place, and time.     Deep Tendon Reflexes: Reflexes are normal and symmetric.  Psychiatric:        Mood and Affect: Mood normal.        Behavior: Behavior normal.        Thought Content: Thought content normal.        Judgment: Judgment normal.      No results found for any visits on 10/01/21.  Assessment & Plan     1.  Hyperlipidemia, unspecified hyperlipidemia type 3 to 4 months with repeat labs - rosuvastatin (CRESTOR) 10 MG tablet; Take 1 tablet (10 mg total) by mouth daily.  Dispense: 90 tablet; Refill: 3 - Amb ref to Medical Nutrition Therapy-MNT  2. Fatty liver Refer to dietitian for diet measures for loss and of these issues - Amb ref to Medical Nutrition Therapy-MNT  3. Prediabetes Diet And exercise stressed. - Amb ref to Medical Nutrition Therapy-MNT   No follow-ups on file.      I, Megan Mans, MD, have reviewed all documentation for this visit. The documentation on 10/05/21 for the exam, diagnosis, procedures, and orders are all accurate and complete.    Logann Whitebread Wendelyn Breslow, MD  [facility] [phone] (phone) [phone] (fax)  [facility]

## 2021-11-27 ENCOUNTER — Encounter: Payer: Self-pay | Admitting: Dietician

## 2021-11-27 ENCOUNTER — Encounter: Payer: BC Managed Care – PPO | Attending: Family Medicine | Admitting: Dietician

## 2021-11-27 VITALS — Ht 66.0 in | Wt 219.8 lb

## 2021-11-27 DIAGNOSIS — E785 Hyperlipidemia, unspecified: Secondary | ICD-10-CM

## 2021-11-27 DIAGNOSIS — E669 Obesity, unspecified: Secondary | ICD-10-CM

## 2021-11-27 DIAGNOSIS — Z6835 Body mass index (BMI) 35.0-35.9, adult: Secondary | ICD-10-CM

## 2021-11-27 DIAGNOSIS — K760 Fatty (change of) liver, not elsewhere classified: Secondary | ICD-10-CM | POA: Diagnosis not present

## 2021-11-27 DIAGNOSIS — R7303 Prediabetes: Secondary | ICD-10-CM

## 2021-11-27 DIAGNOSIS — E66812 Obesity, class 2: Secondary | ICD-10-CM

## 2021-11-27 NOTE — Patient Instructions (Signed)
Work on including foods needed each day.  Continue with regular exercise, great job! Track food intake, aim for about 1400 calories on average for weight loss.

## 2021-11-27 NOTE — Progress Notes (Signed)
Medical Nutrition Therapy: Visit start time: 1615  end time: 1720  Assessment:  Diagnosis: pre-diabetes, hyperlipidemia, fatty liver  Past medical history: GERD, rheumatoid arthritis Psychosocial issues/ stress concerns: none  Preferred learning method:  Visual (reading, not videos)   Current weight: 219.8lbs Height: 5'6" BMI: 35.48 Medications, supplements: reconciled list in medical record  Progress and evaluation:  Patient reports recent HbA1C of 5.9%; highest fasting BG has been 104.  Her personal weight goal is 165lbs. She reports ongoing struggle with weight, used to participate in and work for [employer]. Tasia Catchings, unable to maintain weight loss.  States she has difficulty controlling intake of sweets, once starting will end up eating large portions.  Tried Noom, but did not lose weight at all. Tried [employer] app tracking food for 28 days, met kcal goal of 1700-2000kcal depending on exercise but did not lose.    Physical activity: walking 30 minutes, 5x a week  Dietary Intake:  Usual eating pattern includes 3 meals and 1-2 snacks per day. Dining out frequency: 1 meals per week.  Breakfast: 2 eggs scrambled, with homemade oat bread with butter, apple butter, 1/2 pear, milk; steel cut oats; waffles with syrup; homemade granola without added sugar with yogurt; occ banana Snack:  Lunch: homemade pimento cheese with tortilla chips, dates (did not have fresh fruits), choc covered cashews Snack: varies -- clif bar/ grapes/ cookies/ candy (tries to avoid) Supper: 1/4 chicken/ usu some meat + often sweet potato/ limits pasta ie mac and cheese once a week + green beans (does not like many veg); made herself eat veg soup but did not like it. Snack:  Beverages: unsweetened tea, milk in am, water  Nutrition Care Education: Topics covered:  Basic nutrition: appropriate nutrient balance, appropriate meal and snack schedule, general nutrition guidelines    Weight control:   estimated energy needs for weight loss at 1400kcal, provided guidance for 45% CHO, 25% pro, 30% fat; portion control; managing food cravings; effects of hormonal changes, dieting history on metabolism and weight. Pre-Diabetes:  appropriate meal and snack schedule, appropriate carb intake and balance, healthy carb choices, role of fiber, protein, fat; role of exercise Hyperlipidemia: healthy and unhealthy fats Other:  Mediterranean eating pattern for anti-inflammatory effect as well as benefits for diabetes and liver and heart health; discussed importance of making long term healthy habit changes vs temporary restrictive dieting.  Nutritional Diagnosis:  [place]-2.2 Altered nutrition-related laboratory As related to pre-diabetes, hyperlipidemia.  As evidenced by elevated HbA1C, elevated total cholesterol and LDL. [place]-3.3 Overweight/obesity As related to cyclical dieting, excess calories.  As evidenced by patient with current BMI of 35, working on lifestyle changes to promote weight loss and improve health risk.  Intervention:  Instruction and discussion as noted above. Patient has been working on some positive changes and is motivated to continue. Established goals for change with direction from patient  Education Materials given:  Museum/gallery conservator with food lists, sample meal pattern Tips for Managing Food Cravings Visit summary with goals/ instructions   Learner/ who was taught:  Patient    Level of understanding: Verbalizes/ demonstrates competency   Demonstrated degree of understanding via:   Teach back Learning barriers: None  Willingness to learn/ readiness for change: Eager, change in progress   Monitoring and Evaluation:  Dietary intake, exercise, BG control, blood lipids, and body weight      follow up:  01/09/22 at 5:00pm

## 2021-11-28 ENCOUNTER — Other Ambulatory Visit: Payer: Self-pay

## 2022-01-09 ENCOUNTER — Encounter: Payer: Self-pay | Admitting: Dietician

## 2022-01-09 ENCOUNTER — Other Ambulatory Visit: Payer: Self-pay

## 2022-01-09 ENCOUNTER — Encounter: Payer: BC Managed Care – PPO | Attending: Family Medicine | Admitting: Dietician

## 2022-01-09 VITALS — Wt 216.8 lb

## 2022-01-09 DIAGNOSIS — R7303 Prediabetes: Secondary | ICD-10-CM | POA: Diagnosis not present

## 2022-01-09 DIAGNOSIS — E669 Obesity, unspecified: Secondary | ICD-10-CM | POA: Insufficient documentation

## 2022-01-09 DIAGNOSIS — E785 Hyperlipidemia, unspecified: Secondary | ICD-10-CM | POA: Insufficient documentation

## 2022-01-09 DIAGNOSIS — Z6835 Body mass index (BMI) 35.0-35.9, adult: Secondary | ICD-10-CM | POA: Insufficient documentation

## 2022-01-09 DIAGNOSIS — K760 Fatty (change of) liver, not elsewhere classified: Secondary | ICD-10-CM | POA: Insufficient documentation

## 2022-01-09 NOTE — Patient Instructions (Signed)
Continue to increase low carb vegetables; use varied ways of preparing and seasoning for suitable flavor and texture. Give yourself credit for healthy choices and positive changes; remember occasional indulgences are not bad, and won't negate the healthy choices made.

## 2022-01-09 NOTE — Progress Notes (Signed)
Medical Nutrition Therapy: Visit start time: 1715  end time: 1800  Assessment:  Diagnosis: pre-diabetes, hyperlipidemia, NAFLD Medical history changes: no changes Psychosocial issues/ stress concerns: none  Current weight: 216.8lbs Height: 5'6" BMI: 34.99 Medications, supplement changes: no changes per patient  Progress and evaluation:  Patient reports keeping kcal under 2000 daily, did well for some time keeping to 1500kcal , but has had some family events including birthday parties, with extra eating that led to higher kcal intake afterwards as well.   Feels self control is difficult with some foods, particularly sweets.    Physical activity: walking 30 minutes, 5x a week  Dietary Intake:  Usual eating pattern includes 3 meals and 3-4 snacks per day. Dining out frequency: 2-3 times per month  Breakfast: 2 eggs scrambled, with homemade oat bread with butter, apple butter, 1/2 pear, milk; steel cut oats; waffles with syrup; homemade granola without added sugar with yogurt; occ banana Snack: same as pm Lunch: usually leftovers; chicken breast 1/2 sweet potato, okra Snack: kids zbar (clif) -- 100kcal; occ sweets;  Supper: chicken/ meat + starch + veg Snack: same as pm Beverages: water, unsweetened tea, milk in am  Nutrition Care Education: Topics covered:     Weight control: reviewed progress since previous visit, discussed management of food cravings by using behavior chains to determine where to break the chain, avoiding feelings of guilt, importance of recognizing positive changes and not seeing occasional unhealthy choices as failures, noting progress with food portions, frequency, or choices rather than making complete avoidance the measure of success Diabetes: appropriate carb intake and balance Hyperlipidemia:  appropriate food choices   Nutritional Diagnosis:  [place]-2.2 Altered nutrition-related laboratory As related to pre-diabetes, hyperlipidemia.  As evidenced by elevated  HbA1C, elevated total cholesterol and LDL. [place]-3.3 Overweight/obesity As related to cyclical dieting, excess calories.  As evidenced by patient with current BMI of 34.99, working on lifestyle changes to promote weight loss and improve health risk.  Intervention:  Instruction and discussion as noted above. Patient has developed a chart for herself to make sure she includes all food groups in appropriate amounts. She plans to begin using this chart daily to monitor her food intake. Updated nutrition goals with direction from patient.  Education Materials given:  Visit summary with goals/ instructions   Learner/ who was taught:  Patient    Level of understanding: Verbalizes/ demonstrates competency   Demonstrated degree of understanding via:   Teach back Learning barriers: None  Willingness to learn/ readiness for change: Eager, change in progress  Monitoring and Evaluation:  Dietary intake, exercise, blood sugar, blood liipds, and body weight      follow up:  to be scheduled

## 2022-01-28 ENCOUNTER — Ambulatory Visit: Payer: BC Managed Care – PPO | Admitting: Family Medicine

## 2022-03-20 ENCOUNTER — Encounter: Payer: Self-pay | Admitting: Dietician

## 2022-03-20 NOTE — Progress Notes (Signed)
Patient cancelled her MNT follow up visit for 02/27/22 and has not rescheduled. Sent notification to referring provider. ?

## 2022-03-27 ENCOUNTER — Encounter: Payer: Self-pay | Admitting: Family Medicine

## 2022-03-27 ENCOUNTER — Ambulatory Visit (INDEPENDENT_AMBULATORY_CARE_PROVIDER_SITE_OTHER): Payer: BC Managed Care – PPO | Admitting: Family Medicine

## 2022-03-27 VITALS — BP 138/92 | HR 89

## 2022-03-27 DIAGNOSIS — E785 Hyperlipidemia, unspecified: Secondary | ICD-10-CM | POA: Diagnosis not present

## 2022-03-27 DIAGNOSIS — J309 Allergic rhinitis, unspecified: Secondary | ICD-10-CM

## 2022-03-27 DIAGNOSIS — M0609 Rheumatoid arthritis without rheumatoid factor, multiple sites: Secondary | ICD-10-CM

## 2022-03-27 DIAGNOSIS — Z6837 Body mass index (BMI) 37.0-37.9, adult: Secondary | ICD-10-CM

## 2022-03-27 DIAGNOSIS — R7303 Prediabetes: Secondary | ICD-10-CM

## 2022-03-27 DIAGNOSIS — K760 Fatty (change of) liver, not elsewhere classified: Secondary | ICD-10-CM

## 2022-03-27 DIAGNOSIS — F411 Generalized anxiety disorder: Secondary | ICD-10-CM

## 2022-03-27 NOTE — Progress Notes (Signed)
?  ? ? ?Established patient visit ? ?I,April Miller,acting as a scribe for Wilhemena Durie, MD.,have documented all relevant documentation on the behalf of Wilhemena Durie, MD,as directed by  Wilhemena Durie, MD while in the presence of Wilhemena Durie, MD. ? ? ?Patient: Carolyn Hays   DOB: 1970-12-12   51 y.o. Female  MRN: [id number] ?Visit Date: 03/27/2022 ? ?Today's healthcare provider: Wilhemena Durie, MD  ? ?Chief Complaint  ?Patient presents with  ? Follow-up  ? Hyperlipidemia  ? ?Subjective  ?  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 ?Her last blood pressure check last week was 112/72. ?Overall she is feeling fairly well. ? ?Lipid/Cholesterol, Follow-up ? ?Last lipid panel Other pertinent labs  ?Lab Results  ?Component Value Date  ? CHOL 213 (A) 06/16/2017  ? HDL 59 06/16/2017  ? [place] 128 06/16/2017  ? TRIG 129 06/16/2017  ? Lab Results  ?Component Value Date  ? ALT 15 06/16/2017  ? AST 17 06/16/2017  ? PLT 342 06/16/2017  ? TSH 0.87 12/23/2016  ?  ? ?She was last seen for this 6 months ago.  ?Management since that visit includes none. ?The 10-year ASCVD risk score (Arnett DK, et al., 2019) is: 1.8% ? ?--------------------------------------------------------------------------------------------------- ? ? ?Medications: ?Outpatient Medications Prior to Visit  ?Medication Sig  ? butalbital-acetaminophen-caffeine (FIORICET, ESGIC) 50-325-40 MG tablet Take 1 tablet by mouth 2 (two) times daily as needed for headache.  ? cetirizine (ZYRTEC) 10 MG tablet Take 10 mg by mouth.  ? Cholecalciferol (VITAMIN D3) 2000 UNITS capsule Take by mouth.  ? DULoxetine (CYMBALTA) 30 MG capsule Take 30 mg by mouth daily.  ? ibuprofen (ADVIL,MOTRIN) 600 MG tablet TAKE 1 TABLET(600 MG) BY MOUTH THREE TIMES DAILY AS NEEDED  ? Magnesium 100 MG CAPS Take 400 mg by mouth.  ? Omega-3 Fatty Acids (FISH OIL PO) Take 2,000 mg by mouth 2 (two) times daily.  ?  omeprazole (PRILOSEC) 20 MG capsule Take 20 mg by mouth daily.  ? rosuvastatin (CRESTOR) 10 MG tablet Take 1 tablet (10 mg total) by mouth daily.  ? Tofacitinib Citrate 5 MG TABS Xeljanz 5 mg tablet ? TAKE ONE TABLET BY MOUTH TWICE DAILY. MAY BE TAKEN WITH OR WITHOUT FOOD. STORE AT ROOM TEMPERATURE.  ? vitamin B-12 (CYANOCOBALAMIN) 1000 MCG tablet Take 2,500 mcg by mouth daily.  ? [DISCONTINUED] BINAXNOW COVID-19 AG HOME TEST KIT TEST AS DIRECTED TODAY  ? [DISCONTINUED] predniSONE (DELTASONE) 5 MG tablet Taper down by one tablet by mouth daily starting at 6 day 1, 5 day 2, 4 day 3, 3 day 4, 2 day 5 and 1 day 6. Divide doses among meals and bedtime each day. (Patient not taking: Reported on 03/27/2022)  ? [DISCONTINUED] Zinc 30 MG CAPS Take by mouth.  ? ?No facility-administered medications prior to visit.  ? ? ?Review of Systems ? ?Last thyroid functions ?Lab Results  ?Component Value Date  ? TSH 0.917 03/27/2022  ? ?  ?  Objective  ?  ?BP (!) 138/92 (BP Location: Right Arm, Patient Position: Sitting, Cuff Size: Large)   Pulse 89   LMP 09/07/2015  ?BP Readings from Last 3 Encounters:  ?03/27/22 (!) 138/92  ?10/01/21 133/90  ?11/11/19 120/82  ? ?Wt Readings from Last 3 Encounters:  ?01/09/22 216 lb 12.8 oz (98.3 kg)  ?11/27/21 219 lb 12.8 oz (99.7 kg)  ?10/01/21 216 lb (98 kg)  ? ?  ? ?  Physical Exam ?Vitals reviewed.  ?Constitutional:   ?   Appearance: She is well-developed.  ?Eyes:  ?   Conjunctiva/sclera: Conjunctivae normal.  ?   Pupils: Pupils are equal, round, and reactive to light.  ?Cardiovascular:  ?   Rate and Rhythm: Normal rate and regular rhythm.  ?   Heart sounds: Normal heart sounds.  ?Pulmonary:  ?   Effort: Pulmonary effort is normal.  ?   Breath sounds: Normal breath sounds.  ?Musculoskeletal:  ?   Cervical back: Normal range of motion and neck supple.  ?Skin: ?   General: Skin is warm and dry.  ?Neurological:  ?   General: No focal deficit present.  ?   Mental Status: She is alert and oriented to  person, place, and time.  ?   Deep Tendon Reflexes: Reflexes are normal and symmetric.  ?Psychiatric:     ?   Mood and Affect: Mood normal.     ?   Behavior: Behavior normal.     ?   Thought Content: Thought content normal.     ?   Judgment: Judgment normal.  ?  ? ? ?No results found for any visits on 03/27/22. ? Assessment & Plan  ?  ? ?1. Hyperlipidemia, unspecified hyperlipidemia type ?On rosuvastatin 10 and doing well ?- Lipid panel ?- TSH ?- CBC w/Diff/Platelet ?- Comprehensive Metabolic Panel (CMET) ?- Hemoglobin A1c ? ?2. Prediabetes ?Follow-up A1c.  Weight loss is essential ?- Lipid panel ?- TSH ?- CBC w/Diff/Platelet ?- Comprehensive Metabolic Panel (CMET) ?- Hemoglobin A1c ? ?3. Fatty liver ?Weight loss essential ?- Lipid panel ?- TSH ?- CBC w/Diff/Platelet ?- Comprehensive Metabolic Panel (CMET) ?- Hemoglobin A1c ? ?4. Allergic rhinitis, unspecified seasonality, unspecified trigger ? ?- Lipid panel ?- TSH ?- CBC w/Diff/Platelet ?- Comprehensive Metabolic Panel (CMET) ?- Hemoglobin A1c ? ?5. Rheumatoid arthritis of multiple sites with negative rheumatoid factor ([place]) ?Followed by rheumatology ?- Lipid panel ?- TSH ?- CBC w/Diff/Platelet ?- Comprehensive Metabolic Panel (CMET) ?- Hemoglobin A1c ? ?6. Anxiety, generalized ?Chronic issue.  Recommend regular counseling ? ?7. Class 2 severe obesity due to excess calories with serious comorbidity and body mass index (BMI) of 37.0 to 37.9 in adult [facility]) ?Need a weight every time patient is seen.  Would not see her without a weight being placed in the chart for that visit.  It is major contributing factor to hypertension hyperlipidemia fatty liver and prediabetes ? ? ?No follow-ups on file.  ?   ? ?I, Wilhemena Durie, MD, have reviewed all documentation for this visit. The documentation on 04/03/22 for the exam, diagnosis, procedures, and orders are all accurate and complete. ? ? ? ?Amber Guthridge Cranford Mon, MD  ?[facility] ?[phone]  (phone) ?[phone] (fax) ? ?[facility] ?

## 2022-03-28 LAB — COMPREHENSIVE METABOLIC PANEL
ALT: 22 IU/L (ref 0–32)
AST: 23 IU/L (ref 0–40)
Albumin/Globulin Ratio: 1.3 (ref 1.2–2.2)
Albumin: 4.7 g/dL (ref 3.8–4.9)
Alkaline Phosphatase: 81 IU/L (ref 44–121)
BUN/Creatinine Ratio: 15 (ref 9–23)
BUN: 14 mg/dL (ref 6–24)
Bilirubin Total: 0.6 mg/dL (ref 0.0–1.2)
CO2: 25 mmol/L (ref 20–29)
Calcium: 10.1 mg/dL (ref 8.7–10.2)
Chloride: 98 mmol/L (ref 96–106)
Creatinine, Ser: 0.91 mg/dL (ref 0.57–1.00)
Globulin, Total: 3.6 g/dL (ref 1.5–4.5)
Glucose: 94 mg/dL (ref 70–99)
Potassium: 4.6 mmol/L (ref 3.5–5.2)
Sodium: 142 mmol/L (ref 134–144)
Total Protein: 8.3 g/dL (ref 6.0–8.5)
eGFR: 75 mL/min/{1.73_m2} (ref >59)

## 2022-03-28 LAB — TSH: TSH: 0.917 u[IU]/mL (ref 0.450–4.500)

## 2022-03-28 LAB — HEMOGLOBIN A1C
Est. average glucose Bld gHb Est-mCnc: 117 mg/dL
Hgb A1c MFr Bld: 5.7 % — ABNORMAL HIGH (ref 4.8–5.6)

## 2022-04-08 ENCOUNTER — Ambulatory Visit
Admission: RE | Admit: 2022-04-08 | Discharge: 2022-04-08 | Disposition: A | Discharge status: Home / Self Care | Payer: BC Managed Care – PPO | Source: Ambulatory Visit | Attending: Family Medicine | Admitting: Family Medicine

## 2022-04-08 ENCOUNTER — Ambulatory Visit
Admission: RE | Admit: 2022-04-08 | Discharge: 2022-04-08 | Disposition: A | Discharge status: Home / Self Care | Payer: BC Managed Care – PPO | Attending: Family Medicine | Admitting: Family Medicine

## 2022-04-08 ENCOUNTER — Encounter: Payer: Self-pay | Admitting: Family Medicine

## 2022-04-08 ENCOUNTER — Ambulatory Visit (INDEPENDENT_AMBULATORY_CARE_PROVIDER_SITE_OTHER): Payer: BC Managed Care – PPO | Admitting: Family Medicine

## 2022-04-08 VITALS — BP 126/89 | HR 105 | Resp 16 | Wt 219.0 lb

## 2022-04-08 DIAGNOSIS — M542 Cervicalgia: Secondary | ICD-10-CM

## 2022-04-08 DIAGNOSIS — F411 Generalized anxiety disorder: Secondary | ICD-10-CM

## 2022-04-08 MED ORDER — PREDNISONE 20 MG PO TABS: 20.0000 mg | ORAL_TABLET | Freq: Every day | ORAL | 1 refills | Status: DC

## 2022-04-08 MED ORDER — CYCLOBENZAPRINE HCL 10 MG PO TABS: 10.0000 mg | ORAL_TABLET | Freq: Three times a day (TID) | ORAL | 0 refills | Status: DC | PRN

## 2022-04-08 NOTE — Progress Notes (Signed)
Established patient visit   Patient: Carolyn Hays   DOB: 13-Aug-1971   51 y.o. Female  MRN: [id number] Visit Date: 04/08/2022  Today's healthcare provider: Megan Mansichard Kirke Breach Jr, MD   Rae LipsI,Kathleen J Wolford,acting as a scribe for Megan Mansichard Zanaria Morell Jr, MD.,have documented all relevant documentation on the behalf of Megan MansRichard Anihya Tuma Jr, MD,as directed by  Megan Mansichard Casper Pagliuca Jr, MD while in the presence of Megan Mansichard Chamia Schmutz Jr, MD.  Chief Complaint  Patient presents with   Neck Pain   Subjective    HPI Recent neck pain for the past couple of weeks with no radicular symptoms.  No weakness.  It is a little bit to turn her head to the side  HPI     Neck Pain           Chronicity: new problem   Onset: 1 to 4 weeks ago   Progression since onset: gradually worsening since onset (03/22/22 symptoms increasingly got worse)   Location: right posterior neck   Radiating to: right arm   Pain severity: moderate   Frequency: intermittently   Symptoms worse in : evening   Aggravating factors: flexion , extension and stress   Treatments: NSAIDs   Improvement on treatment: no relief   Chest pain: Absent   Fever: Absent   Headaches: Absent   Joint pains: Absent   Numbness: Absent   Sore throat: Absent   Swallowing problems: Absent   Tingling : Absent   Weakness: Absent       Last edited by Fonda KinderWolford, Kathleen J, CMA on 04/08/2022  3:52 PM.        Medications: Outpatient Medications Prior to Visit  Medication Sig   butalbital-acetaminophen-caffeine (FIORICET, ESGIC) 50-325-40 MG tablet Take 1 tablet by mouth 2 (two) times daily as needed for headache.   cetirizine (ZYRTEC) 10 MG tablet Take 10 mg by mouth.   Cholecalciferol (VITAMIN D3) 2000 UNITS capsule Take by mouth.   DULoxetine (CYMBALTA) 30 MG capsule Take 30 mg by mouth daily.   ibuprofen (ADVIL,MOTRIN) 600 MG tablet TAKE 1 TABLET(600 MG) BY MOUTH THREE TIMES DAILY AS NEEDED   Magnesium 100 MG CAPS Take 400 mg by mouth.   Omega-3  Fatty Acids (FISH OIL PO) Take 2,000 mg by mouth 2 (two) times daily.   omeprazole (PRILOSEC) 20 MG capsule Take 20 mg by mouth daily.   rosuvastatin (CRESTOR) 10 MG tablet Take 1 tablet (10 mg total) by mouth daily.   Tofacitinib Citrate 5 MG TABS Xeljanz 5 mg tablet  TAKE ONE TABLET BY MOUTH TWICE DAILY. MAY BE TAKEN WITH OR WITHOUT FOOD. STORE AT ROOM TEMPERATURE.   vitamin B-12 (CYANOCOBALAMIN) 1000 MCG tablet Take 2,500 mcg by mouth daily.   No facility-administered medications prior to visit.    Review of Systems      Objective    LMP 09/07/2015  BP Readings from Last 3 Encounters:  04/08/22 126/89  03/27/22 (!) 138/92  10/01/21 133/90   Wt Readings from Last 3 Encounters:  04/08/22 219 lb (99.3 kg)  01/09/22 216 lb 12.8 oz (98.3 kg)  11/27/21 219 lb 12.8 oz (99.7 kg)      Physical Exam Vitals reviewed.  Constitutional:      General: She is not in acute distress.    Appearance: She is well-developed.  HENT:     Head: Normocephalic and atraumatic.     Right Ear: Hearing normal.     Left Ear: Hearing normal.     Nose:  Nose normal.  Eyes:     General: Lids are normal. No scleral icterus.       Right eye: No discharge.        Left eye: No discharge.     Conjunctiva/sclera: Conjunctivae normal.  Neck:     Comments: Discomfort with turning her head either way but more to the right than the left Cardiovascular:     Rate and Rhythm: Normal rate and regular rhythm.     Heart sounds: Normal heart sounds.  Pulmonary:     Effort: Pulmonary effort is normal. No respiratory distress.  Musculoskeletal:     Cervical back: Neck supple.  Skin:    Findings: No lesion or rash.  Neurological:     General: No focal deficit present.     Mental Status: She is alert and oriented to person, place, and time.  Psychiatric:        Mood and Affect: Mood normal.        Speech: Speech normal.        Behavior: Behavior normal.        Thought Content: Thought content normal.         Judgment: Judgment normal.      No results found for any visits on 04/08/22.  Assessment & Plan     1. Neck pain Pain this is degenerative month and muscular.  Treat with a 5 days of prednisone and Flexeril as needed. - DG Cervical Spine Complete; Future  2. Anxiety, generalized Chronic ongoing issue.   No follow-ups on file.      I, Megan Mans, MD, have reviewed all documentation for this visit. The documentation on 04/12/22 for the exam, diagnosis, procedures, and orders are all accurate and complete.    Khali Perella Wendelyn Breslow, MD  [facility] [phone] (phone) [phone] (fax)  [facility]

## 2022-05-03 ENCOUNTER — Ambulatory Visit
Admission: EM | Admit: 2022-05-03 | Discharge: 2022-05-03 | Disposition: A | Discharge status: Home / Self Care | Payer: BC Managed Care – PPO | Attending: Physician Assistant | Admitting: Physician Assistant

## 2022-05-03 DIAGNOSIS — B379 Candidiasis, unspecified: Secondary | ICD-10-CM | POA: Diagnosis not present

## 2022-05-03 DIAGNOSIS — T3695XA Adverse effect of unspecified systemic antibiotic, initial encounter: Secondary | ICD-10-CM | POA: Diagnosis not present

## 2022-05-03 DIAGNOSIS — L739 Follicular disorder, unspecified: Secondary | ICD-10-CM

## 2022-05-03 MED ORDER — FLUCONAZOLE 150 MG PO TABS: ORAL_TABLET | ORAL | 0 refills | Status: DC

## 2022-05-03 MED ORDER — DOXYCYCLINE HYCLATE 100 MG PO CAPS: 100.0000 mg | ORAL_CAPSULE | Freq: Two times a day (BID) | ORAL | 0 refills | Status: AC

## 2022-05-03 MED ORDER — MUPIROCIN 2 % EX OINT: 1.0000 "application " | TOPICAL_OINTMENT | Freq: Two times a day (BID) | CUTANEOUS | 0 refills | Status: DC

## 2022-05-03 NOTE — ED Provider Notes (Signed)
[facility]    CSN: [id number] Arrival date & time: 05/03/22  1316      History   Chief Complaint Chief Complaint  Patient presents with   Hair/Scalp Problem    HPI Carolyn Hays is a 51 y.o. female presenting for multiple skin sores on her face and scalp.  Patient reports that she has a history of folliculitis and has seen a dermatologist about it.  She reports that changes in her diet and stress can cause her to break out like this.  She says she has been under a lot of stress at work.  Patient also reports that her diet has not been the best recently.  She says she has been advised by dermatology to take doxycycline for 3 months at a time and then come off of it for couple months and go back on it but she does not want to take antibiotics that often.  She just takes antibiotics when she has flares.  Patient reports that she has been picking at these areas.  No fevers.  No drainage from sites.  No other complaints.   HPI  Past Medical History:  Diagnosis Date   Allergic rhinitis    Alopecia    Anxiety    Arthritis    Calculus of kidney    Carpal tunnel syndrome    Depression    Dyspareunia, female    Eczema    Family history of breast cancer    Neg BRCA/BART 2014; Neg MyRisk update 10/18; riskscore=19.2%/IBIS=19.1%   Family history of ovarian cancer    paternal aunt in her 32s   GERD (gastroesophageal reflux disease)    IBS (irritable bowel syndrome)    Vitamin B12 deficiency neuropathy ([place])    Vitamin D deficiency     Patient Active Problem List   Diagnosis Date Noted   Greater trochanteric pain syndrome 11/04/2019   Hypercholesterolemia 11/04/2019   Insomnia 11/04/2019   Varicose veins of lower extremity 11/04/2019   Decreased libido 08/21/2017   Alopecia 09/11/2015   Carpal tunnel syndrome 09/11/2015   Rheumatoid arthritis ([place]) 09/11/2015   Dermatitis, eczematoid 09/11/2015   Acid reflux 09/11/2015   Anxiety, generalized 09/11/2015   B12  neuropathy ([place]) 09/11/2015   Avitaminosis D 09/11/2015   Disorder of joint of spine 03/01/2014   Inflammatory spondylopathy ([place]) 03/01/2014   Allergic rhinitis 09/11/2009   Cervicogenic headache 09/11/2009   Irritable bowel syndrome 09/11/2009    Past Surgical History:  Procedure Laterality Date   BARTHOLIN GLAND CYST EXCISION  2007   CESAREAN SECTION  10/27/2010   CHOLECYSTECTOMY  10/2006   COLONOSCOPY  2012   one polyp ([facility])   Introital revision  10/26/2015   OTHER SURGICAL HISTORY  2020   BILATERAL VERICOSE VEIN TREATMENT VIA LASER.    OB History     Gravida  1   Para  1   Term  1   Preterm      AB      Living  1      SAB      IAB      Ectopic      Multiple      Live Births  1            Home Medications    Prior to Admission medications   Medication Sig Start Date End Date Taking? Authorizing Provider  doxycycline (VIBRAMYCIN) 100 MG capsule Take 1 capsule (100 mg total) by mouth 2 (two) times daily  for 10 days. 05/03/22 05/13/22 Yes Danton Clap, PA-C  fluconazole (DIFLUCAN) 150 MG tablet Take 1 tablet p.o. every 72 hours for yeast infection 05/03/22  Yes Danton Clap, PA-C  mupirocin ointment (BACTROBAN) 2 % Apply 1 application  topically 2 (two) times daily. 05/03/22  Yes Danton Clap, PA-C  butalbital-acetaminophen-caffeine (FIORICET, ESGIC) [phone] MG tablet Take 1 tablet by mouth 2 (two) times daily as needed for headache. 11/10/17   Jerrol Banana., MD  cetirizine (ZYRTEC) 10 MG tablet Take 10 mg by mouth.    [provider]  Cholecalciferol (VITAMIN D3) 2000 UNITS capsule Take by mouth.    [provider]  cyclobenzaprine (FLEXERIL) 10 MG tablet Take 1 tablet (10 mg total) by mouth 3 (three) times daily as needed for muscle spasms. 04/08/22   Jerrol Banana., MD  DULoxetine (CYMBALTA) 30 MG capsule Take 30 mg by mouth daily. 10/20/21   [provider]  ibuprofen (ADVIL,MOTRIN)  600 MG tablet TAKE 1 TABLET(600 MG) BY MOUTH THREE TIMES DAILY AS NEEDED 12/31/17   Jerrol Banana., MD  Magnesium 100 MG CAPS Take 400 mg by mouth.    [provider]  Omega-3 Fatty Acids (FISH OIL PO) Take 2,000 mg by mouth 2 (two) times daily.    [provider]  omeprazole (PRILOSEC) 20 MG capsule Take 20 mg by mouth daily.    [provider]  predniSONE (DELTASONE) 20 MG tablet Take 1 tablet (20 mg total) by mouth daily with breakfast. 04/08/22   Jerrol Banana., MD  rosuvastatin (CRESTOR) 10 MG tablet Take 1 tablet (10 mg total) by mouth daily. 10/01/21   Jerrol Banana., MD  Tofacitinib Citrate 5 MG TABS Xeljanz 5 mg tablet  TAKE ONE TABLET BY MOUTH TWICE DAILY. MAY BE TAKEN WITH OR WITHOUT FOOD. STORE AT ROOM TEMPERATURE.    [provider]  vitamin B-12 (CYANOCOBALAMIN) 1000 MCG tablet Take 2,500 mcg by mouth daily.    [provider]    Family History Family History  Problem Relation Age of Onset   Hypertension Mother    Diabetes Mother    Sleep apnea Mother    Hyperlipidemia Father    Sleep apnea Father    Colon cancer Father 25   Heart attack Father    Hypertension Father    Healthy Sister    Hypertension Brother    Cancer Maternal Grandfather        lung cancer in his 61s   Kidney disease Paternal Grandfather    Breast cancer Paternal Grandmother 31   Diabetes Paternal Aunt    Ovarian cancer Paternal Aunt 61   Lung cancer Other    Pancreatic cancer Other    Cancer Maternal Uncle 58       tongue   Prostate cancer Paternal Uncle        46s   Prostate cancer Paternal Uncle        72s    Social History Social History   Tobacco Use   Smoking status: Never   Smokeless tobacco: Never  Vaping Use   Vaping Use: Never used  Substance Use Topics   Alcohol use: Not Currently    Alcohol/week: 0.0 - 1.0 standard drinks of alcohol    Comment: occasional   Drug use: No     Allergies    Indomethacin   Review of Systems Review of Systems  Constitutional:  Negative for fever.  Skin:  Positive for color change, rash and wound.  Neurological:  Negative for weakness.     Physical Exam Triage Vital Signs ED Triage Vitals [05/03/22 1340]  Enc Vitals Group     BP 135/85     Pulse Rate 98     Resp 18     Temp 98.3 F (36.8 C)     Temp Source Oral     SpO2 100 %     Weight      Height      Head Circumference      Peak Flow      Pain Score 3     Pain Loc      Pain Edu?      Excl. in [place]?    No data found.  Updated Vital Signs BP 135/85 (BP Location: Left Arm)   Pulse 98   Temp 98.3 F (36.8 C) (Oral)   Resp 18   LMP 09/07/2015   SpO2 100%   Physical Exam Vitals and nursing note reviewed.  Constitutional:      General: She is not in acute distress.    Appearance: Normal appearance. She is not ill-appearing or toxic-appearing.  HENT:     Head: Normocephalic and atraumatic.  Eyes:     General: No scleral icterus.       Right eye: No discharge.        Left eye: No discharge.     Conjunctiva/sclera: Conjunctivae normal.  Cardiovascular:     Rate and Rhythm: Normal rate and regular rhythm.  Pulmonary:     Effort: Pulmonary effort is normal. No respiratory distress.  Musculoskeletal:     Cervical back: Neck supple.  Skin:    General: Skin is dry.     Comments: Multiple erythematous papules.  3 erythematous papules on face and to of scalp.  One of the scalp papules is tender to palpation with a overlying scab and erythema.  Neurological:     General: No focal deficit present.     Mental Status: She is alert. Mental status is at baseline.     Motor: No weakness.     Gait: Gait normal.  Psychiatric:        Mood and Affect: Mood normal.        Behavior: Behavior normal.        Thought Content: Thought content normal.      UC Treatments / Results  Labs (all labs ordered are listed, but only abnormal results are displayed) Labs Reviewed - No  data to display  EKG   Radiology No results found.  Procedures Procedures (including critical care time)  Medications Ordered in UC Medications - No data to display  Initial Impression / Assessment and Plan / UC Course  I have reviewed the triage vital signs and the nursing notes.  Pertinent labs & imaging results that were available during my care of the patient were reviewed by me and considered in my medical decision making (see chart for details).  51 year old female presents for multiple erythematous papules around hair follicles over the past few days.  History of folliculitis and patient believes she is having a current breakout.  Has been seen by dermatology for this and diagnosed with folliculitis.  Has taken doxycycline in the past.  Her presentation today is consistent with multiple areas of folliculitis.  We will treat with doxycycline and mupirocin.  Patient also asked for Diflucan as she has antibiotic-induced yeast infections.  Sent that as well.  Follow-up  as needed.   Final Clinical Impressions(s) / UC Diagnoses   Final diagnoses:  Folliculitis  Antibiotic-induced yeast infection     Discharge Instructions      -I have sent antibiotics and ointment to the pharmacy.  Symptoms may be gone after 7 days.  If so, you to help take the last 3 days of the antibiotic. - I have also sent Diflucan in case you were to develop yeast infection.     ED Prescriptions     Medication Sig Dispense Auth. Provider   doxycycline (VIBRAMYCIN) 100 MG capsule Take 1 capsule (100 mg total) by mouth 2 (two) times daily for 10 days. 20 capsule Laurene Footman B, PA-C   mupirocin ointment (BACTROBAN) 2 % Apply 1 application  topically 2 (two) times daily. 22 g Laurene Footman B, PA-C   fluconazole (DIFLUCAN) 150 MG tablet Take 1 tablet p.o. every 72 hours for yeast infection 2 tablet Danton Clap, PA-C      PDMP not reviewed this encounter.   Danton Clap, PA-C 05/03/22  1444

## 2022-05-03 NOTE — Discharge Instructions (Addendum)
-  I have sent antibiotics and ointment to the pharmacy.  Symptoms may be gone after 7 days.  If so, you to help take the last 3 days of the antibiotic. - I have also sent Diflucan in case you were to develop yeast infection.

## 2022-05-03 NOTE — ED Triage Notes (Signed)
Pt present sore located on her scalp. The area is painful to touch and she has picked some of the sore open.

## 2022-05-06 ENCOUNTER — Ambulatory Visit: Payer: BLUE CROSS/BLUE SHIELD | Admitting: Licensed Practical Nurse

## 2022-07-03 ENCOUNTER — Encounter: Payer: Self-pay | Admitting: Licensed Practical Nurse

## 2022-07-03 ENCOUNTER — Ambulatory Visit (INDEPENDENT_AMBULATORY_CARE_PROVIDER_SITE_OTHER): Payer: BC Managed Care – PPO | Admitting: Licensed Practical Nurse

## 2022-07-03 VITALS — BP 128/84 | Ht 66.5 in | Wt 219.0 lb

## 2022-07-03 DIAGNOSIS — Z01419 Encounter for gynecological examination (general) (routine) without abnormal findings: Secondary | ICD-10-CM

## 2022-07-03 DIAGNOSIS — L689 Hypertrichosis, unspecified: Secondary | ICD-10-CM

## 2022-07-03 NOTE — Progress Notes (Signed)
Gynecology Annual Exam  PCP: Maple Hudson., MD  Chief Complaint:  Chief Complaint  Patient presents with   Annual Exam    History of Present Illness:Patient is a 51 y.o. G1P1001 presents for annual exam. The patient has had excessive facial hair for 3-4 years, she gets electrolysis and her practitioner keeps on asking her about medical conditions, as far as she knows she has never been worked up for anything. Alos, Carolyn Hays has been experiencing tiredness for the last 6 weeks, she is nt int he mood to cook and seems to have lost interest in doing most things.  She denies any symptoms of depression, reports her sleep has always been on and off, has not had any fevers, unexplained or sudden changes in weight, no changes in her appetite or bowel habits. Her PCP has checked labs and they are all  normal.  Her PCP referred to an endo at [facility], but the provider will not see her.   -She has a long history of pelvic pain, she has tried estrogen cream and had surgery. The surgery helped for about 1 year.  She has not had IC in 3 years d/t the pain. She is not too bothered that she does not have IC.  LMP: Patient's last menstrual period was 09/07/2015.  The patient is not sexually active. She has dyspareunia.  The patient does perform self breast exams.  There is notable family history of breast or ovarian cancer in her family.  The patient wears seatbelts: yes.   The patient has regular exercise: yes.  Walking "when I am in the mood" Is a teacher Lives with her husband, feels safe Describes stress low  Gets Calcium from dairy products, takes a Vit D supplement   The patient denies current symptoms of depression.     Review of Systems: Review of Systems  Constitutional:  Positive for malaise/fatigue.  Eyes: Negative.   Gastrointestinal: Negative.   Genitourinary: Negative.   Musculoskeletal:  Positive for joint pain.  Neurological: Negative.   Endo/Heme/Allergies:         Hot/cold intolerance  Psychiatric/Behavioral: Negative.      Past Medical History:  Patient Active Problem List   Diagnosis Date Noted   Greater trochanteric pain syndrome 11/04/2019   Hypercholesterolemia 11/04/2019   Insomnia 11/04/2019   Varicose veins of lower extremity 11/04/2019   Decreased libido 08/21/2017   Alopecia 09/11/2015   Carpal tunnel syndrome 09/11/2015   Rheumatoid arthritis ([facility]) 09/11/2015   Dermatitis, eczematoid 09/11/2015   Acid reflux 09/11/2015   Anxiety, generalized 09/11/2015   B12 neuropathy ([facility]) 09/11/2015   Avitaminosis D 09/11/2015   Disorder of joint of spine 03/01/2014    Overview:  Overview:  A. S/p methotrexate and sulfasalazine B. remicade C. Positive HLAB27    Inflammatory spondylopathy ([facility]) 03/01/2014    Overview:  Overview:  Overview:  A. S/p methotrexate and sulfasalazine B. remicade C. Positive HLAB27    Allergic rhinitis 09/11/2009   Cervicogenic headache 09/11/2009   Irritable bowel syndrome 09/11/2009    Past Surgical History:  Past Surgical History:  Procedure Laterality Date   BARTHOLIN GLAND CYST EXCISION  2007   CESAREAN SECTION  10/27/2010   CHOLECYSTECTOMY  10/2006   COLONOSCOPY  2012   one polyp ([facility])   Introital revision  10/26/2015   OTHER SURGICAL HISTORY  2020   BILATERAL VERICOSE VEIN TREATMENT VIA LASER.    Gynecologic History:  Patient's last menstrual period was 09/07/2015. Last  Pap: Results were: 2019 no abnormalities  Last mammogram: 2020 Results were: BI-RAD I  Obstetric History: G1P1001  Family History:  Family History  Problem Relation Age of Onset   Hypertension Mother    Diabetes Mother    Sleep apnea Mother    Hyperlipidemia Father    Sleep apnea Father    Colon cancer Father 14   Heart attack Father    Hypertension Father    Healthy Sister    Hypertension Brother    Cancer Maternal Grandfather        lung cancer in his 91s   Kidney disease Paternal Grandfather     Breast cancer Paternal Grandmother 15   Diabetes Paternal Aunt    Ovarian cancer Paternal Aunt 12   Lung cancer Other    Pancreatic cancer Other    Cancer Maternal Uncle 31       tongue   Prostate cancer Paternal Uncle        66s   Prostate cancer Paternal Uncle        40s    Social History:  Social History   Socioeconomic History   Marital status: Married    Spouse name: Not on file   Number of children: 1   Years of education: Not on file   Highest education level: Not on file  Occupational History   Occupation: Runner, broadcasting/film/video  Tobacco Use   Smoking status: Never   Smokeless tobacco: Never  Vaping Use   Vaping Use: Never used  Substance and Sexual Activity   Alcohol use: Not Currently    Alcohol/week: 0.0 - 1.0 standard drinks of alcohol    Comment: occasional   Drug use: No   Sexual activity: Not Currently    Birth control/protection: Post-menopausal  Other Topics Concern   Not on file  Social History Narrative   Not on file   Social Determinants of Health   Financial Resource Strain: Not on file  Food Insecurity: Not on file  Transportation Needs: Not on file  Physical Activity: Not on file  Stress: Not on file  Social Connections: Not on file  Intimate Partner Violence: Not on file    Allergies:  Allergies  Allergen Reactions   Indomethacin Other (See Comments)    severe H/As severe H/As    Medications: Prior to Admission medications   Medication Sig Start Date End Date Taking? Authorizing Provider  cetirizine (ZYRTEC) 10 MG tablet Take 10 mg by mouth.   Yes [provider]  Cholecalciferol (VITAMIN D3) 2000 UNITS capsule Take by mouth.   Yes [provider]  DULoxetine (CYMBALTA) 30 MG capsule Take 30 mg by mouth daily. 10/20/21  Yes [provider]  ibuprofen (ADVIL,MOTRIN) 600 MG tablet TAKE 1 TABLET(600 MG) BY MOUTH THREE TIMES DAILY AS NEEDED 12/31/17  Yes Maple Hudson., MD  Magnesium 100 MG CAPS Take 400 mg  by mouth.   Yes [provider]  Omega-3 Fatty Acids (FISH OIL PO) Take 2,000 mg by mouth 2 (two) times daily.   Yes [provider]  omeprazole (PRILOSEC) 20 MG capsule Take 20 mg by mouth daily.   Yes [provider]  rosuvastatin (CRESTOR) 10 MG tablet Take 1 tablet (10 mg total) by mouth daily. 10/01/21  Yes Maple Hudson., MD  Tofacitinib Citrate 5 MG TABS Xeljanz 5 mg tablet  TAKE ONE TABLET BY MOUTH TWICE DAILY. MAY BE TAKEN WITH OR WITHOUT FOOD. STORE AT ROOM TEMPERATURE.   Yes [provider]  vitamin B-12 (CYANOCOBALAMIN) 1000 MCG tablet Take 2,500 mcg by mouth daily.   Yes [provider]    Physical Exam Vitals: Blood pressure 128/84, height 5' 6.5" (1.689 m), weight 219 lb (99.3 kg), last menstrual period 09/07/2015.  General: NAD HEENT: normocephalic, anicteric Thyroid: no enlargement, no palpable nodules Pulmonary: No increased work of breathing, CTAB Cardiovascular: RRR, distal pulses 2+ Breast: Breast symmetrical, no tenderness, no palpable nodules or masses, no skin or nipple retraction present, no nipple discharge.  No axillary or supraclavicular lymphadenopathy. Abdomen: NABS, soft, non-tender, non-distended.  Umbilicus without lesions.  No hepatomegaly, splenomegaly or masses palpable. No evidence of hernia  Genitourinary:  External: Normal external female genitalia. Labia atrophic a little reddened  Normal urethral meatus, normal Bartholin's and Skene's glands.    Vagina: Normal vaginal mucosa, no evidence of prolapse.  Able to introduce one finger easily, with two fingers pt had intense pain at the introitus on the left vaginal wall.   Cervix: spec exam not done  Uterus: Non-enlarged, mobile, normal contour.  No CMT  Adnexa: ovaries non-enlarged, no adnexal masses  Rectal: deferred  Lymphatic: no evidence of inguinal lymphadenopathy Extremities: no edema, erythema, or tenderness Neurologic: Grossly  intact Psychiatric: mood appropriate, affect full  Female chaperone present for pelvic and breast  portions of the physical exam     Assessment: 51 y.o. G1P1001 routine annual exam  Plan: Problem List Items Addressed This Visit   None Visit Diagnoses     Excess body and facial hair    -  Primary   Relevant Orders   Prolactin   Testosterone,Free and Total   Androstenedione       1) Mammogram - recommend yearly screening mammogram.  Mammogram Was ordered today  2) STI screening  wasoffered and declined  3) ASCCP guidelines and rational discussed.  Patient opts for every 5 years screening interval due 2024   4) Osteoporosis  - per USPTF routine screening DEXA at age 46   Consider FDA-approved medical therapies in postmenopausal women and men aged 53 years and older, based on the following: a) A hip or vertebral (clinical or morphometric) fracture b) T-score ? -2.5 at the femoral neck or spine after appropriate evaluation to exclude secondary causes C) Low bone mass (T-score between -1.0 and -2.5 at the femoral neck or spine) and a 10-year probability of a hip fracture ? 3% or a 10-year probability of a major osteoporosis-related fracture ? 20% based on the [place]-adapted WHO algorithm   5) Routine healthcare maintenance including cholesterol, diabetes screening discussed managed by PCP  6) Colonoscopy UP to date .  Screening recommended starting at age 64 for average risk individuals, age 80 for individuals deemed at increased risk (including African Americans) and recommended to continue until age 37.  For patient age 81-85 individualized approach is recommended.  Gold standard screening is via colonoscopy, Cologuard screening is an acceptable alternative for patient unwilling or unable to undergo colonoscopy.  "Colorectal cancer screening for average?risk adults: 2018 guideline update from the American Cancer Society"CA: A Cancer Journal for Clinicians: Apr 22, 2017   7)  fatigue, excess facial hair  Lab Orders         Prolactin         Testosterone,Free and Total         Androstenedione     Referral placed for endocrinology  Reviewed some of her symptoms could be related to carrying excess weight and some insulin resistance, her most  recent HA1C 5.7, rec at least 10% weight loss and seeing endo for a full evaluation.   8) RTC 1 year   Carie Caddy, CNM  [facility] 07/03/2022, 2:23 PM

## 2022-07-09 ENCOUNTER — Encounter: Payer: Self-pay | Admitting: Licensed Practical Nurse

## 2022-07-09 LAB — ANDROSTENEDIONE: Androstenedione LCMS: 55 ng/dL (ref 41–262)

## 2022-07-09 LAB — TESTOSTERONE,FREE AND TOTAL
Testosterone, Free: 2.6 pg/mL (ref 0.0–4.2)
Testosterone: 30 ng/dL (ref 4–50)

## 2022-07-09 LAB — PROLACTIN: Prolactin: 11.5 ng/mL (ref 4.8–23.3)

## 2022-08-12 ENCOUNTER — Ambulatory Visit
Admission: RE | Admit: 2022-08-12 | Discharge: 2022-08-12 | Disposition: A | Discharge status: Home / Self Care | Payer: BC Managed Care – PPO | Source: Ambulatory Visit | Attending: Licensed Practical Nurse | Admitting: Licensed Practical Nurse

## 2022-08-12 DIAGNOSIS — Z1231 Encounter for screening mammogram for malignant neoplasm of breast: Secondary | ICD-10-CM | POA: Insufficient documentation

## 2022-08-12 DIAGNOSIS — Z01419 Encounter for gynecological examination (general) (routine) without abnormal findings: Secondary | ICD-10-CM | POA: Insufficient documentation

## 2022-08-14 ENCOUNTER — Other Ambulatory Visit: Payer: Self-pay | Admitting: Licensed Practical Nurse

## 2022-08-14 DIAGNOSIS — R928 Other abnormal and inconclusive findings on diagnostic imaging of breast: Secondary | ICD-10-CM

## 2022-08-14 DIAGNOSIS — N6489 Other specified disorders of breast: Secondary | ICD-10-CM

## 2022-09-22 ENCOUNTER — Other Ambulatory Visit: Payer: BC Managed Care – PPO

## 2022-09-24 ENCOUNTER — Other Ambulatory Visit: Payer: BC Managed Care – PPO

## 2022-09-24 ENCOUNTER — Inpatient Hospital Stay: Admission: RE | Admit: 2022-09-24 | Payer: BC Managed Care – PPO | Source: Ambulatory Visit

## 2022-09-25 ENCOUNTER — Other Ambulatory Visit: Payer: Self-pay

## 2022-09-25 ENCOUNTER — Telehealth: Payer: Self-pay | Admitting: Family Medicine

## 2022-09-25 DIAGNOSIS — E785 Hyperlipidemia, unspecified: Secondary | ICD-10-CM

## 2022-09-25 MED ORDER — ROSUVASTATIN CALCIUM 10 MG PO TABS: 10.0000 mg | ORAL_TABLET | Freq: Every day | ORAL | 0 refills | Status: DC

## 2022-09-25 NOTE — Telephone Encounter (Signed)
[place] faxed refill request for the following medications:  rosuvastatin (CRESTOR) 10 MG tablet    Please advise

## 2022-10-08 ENCOUNTER — Ambulatory Visit
Admission: RE | Admit: 2022-10-08 | Discharge: 2022-10-08 | Disposition: A | Discharge status: Home / Self Care | Payer: BC Managed Care – PPO | Source: Ambulatory Visit | Attending: Licensed Practical Nurse | Admitting: Licensed Practical Nurse

## 2022-10-08 DIAGNOSIS — R928 Other abnormal and inconclusive findings on diagnostic imaging of breast: Secondary | ICD-10-CM

## 2022-10-08 DIAGNOSIS — N6489 Other specified disorders of breast: Secondary | ICD-10-CM

## 2022-10-09 ENCOUNTER — Encounter: Payer: BC Managed Care – PPO | Admitting: Family Medicine

## 2022-10-09 ENCOUNTER — Encounter: Payer: Self-pay | Admitting: Family Medicine

## 2022-10-09 ENCOUNTER — Ambulatory Visit (INDEPENDENT_AMBULATORY_CARE_PROVIDER_SITE_OTHER): Payer: BC Managed Care – PPO | Admitting: Family Medicine

## 2022-10-09 VITALS — BP 141/91 | HR 98 | Temp 97.8°F | Resp 16 | Ht 66.0 in | Wt 222.6 lb

## 2022-10-09 DIAGNOSIS — Z0000 Encounter for general adult medical examination without abnormal findings: Secondary | ICD-10-CM | POA: Diagnosis not present

## 2022-10-09 DIAGNOSIS — E7800 Pure hypercholesterolemia, unspecified: Secondary | ICD-10-CM | POA: Diagnosis not present

## 2022-10-09 DIAGNOSIS — Z1159 Encounter for screening for other viral diseases: Secondary | ICD-10-CM | POA: Diagnosis not present

## 2022-10-09 DIAGNOSIS — M0609 Rheumatoid arthritis without rheumatoid factor, multiple sites: Secondary | ICD-10-CM

## 2022-10-09 DIAGNOSIS — Z114 Encounter for screening for human immunodeficiency virus [HIV]: Secondary | ICD-10-CM | POA: Diagnosis not present

## 2022-10-09 DIAGNOSIS — E559 Vitamin D deficiency, unspecified: Secondary | ICD-10-CM

## 2022-10-09 DIAGNOSIS — R059 Cough, unspecified: Secondary | ICD-10-CM | POA: Insufficient documentation

## 2022-10-09 DIAGNOSIS — E538 Deficiency of other specified B group vitamins: Secondary | ICD-10-CM

## 2022-10-09 DIAGNOSIS — R0683 Snoring: Secondary | ICD-10-CM

## 2022-10-09 DIAGNOSIS — K589 Irritable bowel syndrome without diarrhea: Secondary | ICD-10-CM

## 2022-10-09 DIAGNOSIS — G4719 Other hypersomnia: Secondary | ICD-10-CM

## 2022-10-09 DIAGNOSIS — G63 Polyneuropathy in diseases classified elsewhere: Secondary | ICD-10-CM

## 2022-10-09 MED ORDER — ALBUTEROL SULFATE HFA 108 (90 BASE) MCG/ACT IN AERS: 2.0000 | INHALATION_SPRAY | RESPIRATORY_TRACT | 0 refills | Status: DC | PRN

## 2022-10-09 NOTE — Progress Notes (Signed)
I,Joseline E Rosas,acting as a scribe for [employer], MD.,have documented all relevant documentation on the behalf of [name], MD,as directed by  [name], MD while in the presence of [name], MD.   Complete physical exam   Patient: Carolyn Hays   DOB: 11-19-1971   51 y.o. Female  MRN: [id number] Visit Date: 10/09/2022  Today's healthcare provider: [name], MD   Chief Complaint  Patient presents with   Annual Exam   Subjective    SHARNIECE GIBBON is a 51 y.o. female who presents today for a complete physical exam.  She reports consuming a general diet. Home exercise routine includes walks 3 times a week for 1 - 2 miles. She generally feels fairly well. She reports sleeping fairly well. She does have additional problems to discuss today including URI symptoms. Patient voiced understanding of addressing additional issues and its impact on billing in addition to physical. She was in agreement and wished to proceed.   HPI   Health Maintenance She is follow by GYN Mammogram: UTD Colonoscopy: had it at [facility] in 5 years Shingles: will check with her rheumatology prior to immunization   Flu vaccine and covid vaccine planned but was sick so vaccine appts kept getting postponed   URI Symptoms  She reports having chest tightness but feels more congested  She states that her husband told her she was wheezing while walking around their home  She reports that her breathing sounds like rattling and coughing  Sometimes she wakes up with abnormal breathing sounds  She reports that she felt tired, had a HA, congestion, coughing and sore throat one week ago  At that time she had negative covid test  Cough is her only remaining symptom and is nonproductive  She has the sensation of congestion moving in her chest  She is denies fever  Has been taking mucinex but has not noticed  much of a difference  She coughs after laughing  She denies SOB  Overall, cough has improved since last week while taking delsym She denies diarrhea, nausea, lightheadedness    Snoring  Patient is requesting to have home sleep study as she reports snoring, excessive daytime sleepiness and concern for OSA  She reports that her family says that she snores loudly  She denies report of abnormal breathing per her family  She prefer to have the study done at home because she does not want to go to a facility  She states that she feel tired throughout the day   Past Medical History:  Diagnosis Date   Allergic rhinitis    Alopecia    Anxiety    Arthritis    Calculus of kidney    Carpal tunnel syndrome    Depression    Dyspareunia, female    Eczema    Family history of breast cancer    Neg BRCA/BART 2014; Neg MyRisk update 10/18; riskscore=19.2%/IBIS=19.1%   Family history of ovarian cancer    paternal aunt in her 42s   GERD (gastroesophageal reflux disease)    IBS (irritable bowel syndrome)    Vitamin B12 deficiency neuropathy ([place])    Vitamin D deficiency    Past Surgical History:  Procedure Laterality Date   BARTHOLIN GLAND CYST EXCISION  2007   CESAREAN SECTION  10/27/2010   CHOLECYSTECTOMY  10/2006   COLONOSCOPY  2012   one polyp ([place])   Introital revision  10/26/2015   OTHER SURGICAL HISTORY  2020   BILATERAL VERICOSE VEIN TREATMENT VIA LASER.   Social History   Socioeconomic History   Marital status: Married    Spouse name: Not on file   Number of children: 1   Years of education: Not on file   Highest education level: Not on file  Occupational History   Occupation: Teacher  Tobacco Use   Smoking status: Never   Smokeless tobacco: Never  Vaping Use   Vaping Use: Never used  Substance and Sexual Activity   Alcohol use: Not Currently    Alcohol/week: 0.0 - 1.0 standard drinks of alcohol    Comment: occasional   Drug use: No   Sexual activity:  Not Currently    Birth control/protection: Post-menopausal  Other Topics Concern   Not on file  Social History Narrative   Not on file   Social Determinants of Health   Financial Resource Strain: Not on file  Food Insecurity: Not on file  Transportation Needs: Not on file  Physical Activity: Not on file  Stress: Not on file  Social Connections: Not on file  Intimate Partner Violence: Not on file   Family Status  Relation Name Status   Mother  Alive   Father  Deceased       colon cancer   Sister  Alive   Brother  Alive   Son  Alive   MGF  (Not Specified)   PGF  Alive   PGM  Deceased   Pat Aunt  Deceased   Other MGrAunt Deceased   Mat Uncle  (Not Specified)   Annamarie Major  (Not Specified)   Psychiatrist  (Not Specified)   Family History  Problem Relation Age of Onset   Hypertension Mother    Diabetes Mother    Sleep apnea Mother    Hyperlipidemia Father    Sleep apnea Father    Colon cancer Father 67   Heart attack Father    Hypertension Father    Healthy Sister    Hypertension Brother    Cancer Maternal Grandfather        lung cancer in his 36s   Kidney disease Paternal Grandfather    Breast cancer Paternal Grandmother 71   Diabetes Paternal Aunt    Ovarian cancer Paternal Aunt 80   Lung cancer Other    Pancreatic cancer Other    Cancer Maternal Uncle 58       tongue   Prostate cancer Paternal Uncle        42s   Prostate cancer Paternal Uncle        17s   Allergies  Allergen Reactions   Indomethacin Other (See Comments)    severe H/As severe H/As    Patient Care Team: [name], MD as PCP - General (Family Medicine)   Medications: Outpatient Medications Prior to Visit  Medication Sig   cetirizine (ZYRTEC) 10 MG tablet Take 10 mg by mouth.   Cholecalciferol (VITAMIN D3) 2000 UNITS capsule Take by mouth.   DULoxetine (CYMBALTA) 30 MG capsule Take 30 mg by mouth daily.   Magnesium 100 MG CAPS Take 400 mg by mouth.   Omega-3 Fatty  Acids (FISH OIL PO) Take 2,000 mg by mouth 2 (two) times daily.   omeprazole (PRILOSEC) 20 MG capsule Take 20 mg by mouth daily.   rosuvastatin (CRESTOR) 10 MG tablet Take 1 tablet (10 mg total) by mouth daily.   Tofacitinib Citrate 5 MG TABS Xeljanz 5 mg tablet  TAKE ONE TABLET BY MOUTH  TWICE DAILY. MAY BE TAKEN WITH OR WITHOUT FOOD. STORE AT ROOM TEMPERATURE.   vitamin B-12 (CYANOCOBALAMIN) 1000 MCG tablet Take 2,500 mcg by mouth daily.   ibuprofen (ADVIL,MOTRIN) 600 MG tablet TAKE 1 TABLET(600 MG) BY MOUTH THREE TIMES DAILY AS NEEDED   No facility-administered medications prior to visit.    Review of Systems  HENT:  Positive for congestion and rhinorrhea.   Respiratory:  Positive for cough and chest tightness.   Endocrine: Positive for cold intolerance and heat intolerance.  Musculoskeletal:  Positive for arthralgias, back pain, myalgias, neck pain and neck stiffness.  Allergic/Immunologic: Positive for immunocompromised state.  All other systems reviewed and are negative.     Objective    BP (!) 141/91 (BP Location: Left Arm, Patient Position: Sitting, Cuff Size: Normal)   Pulse 98   Temp 97.8 F (36.6 C) (Oral)   Resp 16   Ht _0  (1.676 m)   Wt 222 lb 9.6 oz (101 kg)   LMP 09/07/2015   BMI 35.93 kg/m     Physical Exam Vitals reviewed.  Constitutional:      General: She is not in acute distress.    Appearance: Normal appearance. She is not ill-appearing, toxic-appearing or diaphoretic.  HENT:     Head: Normocephalic and atraumatic.     Right Ear: Tympanic membrane and external ear normal. There is no impacted cerumen.     Left Ear: Tympanic membrane and external ear normal. There is no impacted cerumen.     Nose: Nose normal.     Mouth/Throat:     Pharynx: Oropharynx is clear.  Eyes:     General: No scleral icterus.    Extraocular Movements: Extraocular movements intact.     Conjunctiva/sclera: Conjunctivae normal.     Pupils: Pupils are equal, round, and  reactive to light.  Cardiovascular:     Rate and Rhythm: Normal rate and regular rhythm.     Pulses: Normal pulses.     Heart sounds: Normal heart sounds. No murmur heard.    No friction rub. No gallop.  Pulmonary:     Effort: Pulmonary effort is normal. No tachypnea, accessory muscle usage, prolonged expiration or respiratory distress.     Breath sounds: Examination of the right-middle field reveals wheezing. Examination of the left-middle field reveals wheezing. Examination of the right-lower field reveals wheezing. Examination of the left-lower field reveals wheezing. Wheezing present. No decreased breath sounds, rhonchi or rales.     Comments: Faint end expiratory wheezing noted on exam  No crackles at lung bases  Abdominal:     General: Bowel sounds are normal. There is no distension.     Palpations: Abdomen is soft. There is no mass.     Tenderness: There is no abdominal tenderness. There is no guarding.  Musculoskeletal:        General: No deformity.     Cervical back: Normal range of motion and neck supple. No rigidity.     Right lower leg: No edema.     Left lower leg: No edema.  Lymphadenopathy:     Cervical: No cervical adenopathy.  Skin:    General: Skin is warm.     Capillary Refill: Capillary refill takes less than 2 seconds.     Findings: No erythema or rash.  Neurological:     General: No focal deficit present.     Mental Status: She is alert and oriented to person, place, and time.     Motor: No weakness.  Gait: Gait normal.  Psychiatric:        Mood and Affect: Mood normal.        Behavior: Behavior normal.       Last depression screening scores    10/09/2022    4:14 PM 11/27/2021    4:21 PM 07/22/2017    4:38 PM  PHQ 2/9 Scores  PHQ - 2 Score 2 0 3  PHQ- 9 Score 7  10   Last fall risk screening    10/09/2022    4:14 PM  [place] in the past year? 0  Number falls in past yr: 0  Injury with Fall? 0  Risk for fall due to : No Fall Risks    Last Audit-C alcohol use screening    10/09/2022    4:15 PM  Alcohol Use Disorder Test (AUDIT)  1. How often do you have a drink containing alcohol? 1  2. How many drinks containing alcohol do you have on a typical day when you are drinking? 0  3. How often do you have six or more drinks on one occasion? 0  AUDIT-C Score 1   A score of 3 or more in women, and 4 or more in men indicates increased risk for alcohol abuse, EXCEPT if all of the points are from question 1   No results found for any visits on 10/09/22.  Assessment & Plan    Routine Health Maintenance and Physical Exam  Exercise Activities and Dietary recommendations  Goals   None     Immunization History  Administered Date(s) Administered   Hepatitis B 09/21/2001, 11/08/2001, 03/29/2002   Influenza, Seasonal, Injecte, Preservative Fre 10/10/2015   Influenza,inj,Quad PF,6+ Mos 07/22/2017   Influenza,inj,Quad PF,6-35 Mos 09/08/2016   Influenza-Unspecified 07/30/2019   Moderna Sars-Covid-2 Vaccination 02/23/2021   PFIZER(Purple Top)SARS-COV-2 Vaccination 02/15/2020   Tdap 06/08/2017   Unspecified SARS-COV-2 Vaccination 01/24/2020, 02/15/2020, 07/13/2020    Health Maintenance  Topic Date Due   Hepatitis C Screening  Never done   COLONOSCOPY (Pts 45-66yr Insurance coverage will need to be confirmed)  Never done   Zoster Vaccines- Shingrix (1 of 2) Never done   COVID-19 Vaccine (6 - Mixed Product series) 04/20/2021   INFLUENZA VACCINE  06/24/2022   PAP SMEAR-Modifier  11/11/2023   MAMMOGRAM  08/12/2024   HIV Screening  Completed   HPV VACCINES  Aged Out    Discussed health benefits of physical activity, and encouraged her to engage in regular exercise appropriate for her age and condition.  Problem List Items Addressed This Visit       Digestive   Irritable bowel syndrome   Relevant Orders   Comprehensive metabolic panel     Nervous and Auditory   B12 neuropathy (HCC)    We will recheck Vitamin  B12 levels today       Relevant Orders   Comprehensive metabolic panel   Vitamin BQ73    Musculoskeletal and Integument   Rheumatoid arthritis ([place]    Stable  Follows with rheumatology  On XMorrie Sheldon        Other   Avitaminosis D    Will check Vitamin D levels  Not currently on supplementation       Relevant Orders   Vitamin D (25 hydroxy)   Hypercholesterolemia    Will check lipid panel  Continue rosuvastatin 190mdaily        Relevant Orders   Lipid panel   Encounter for hepatitis C screening  test for low risk patient    Hep C screening collected       Relevant Orders   Hepatitis C Antibody   Screening for HIV (human immunodeficiency virus)    Agreeable to HIV screening today       Relevant Orders   HIV antibody (with reflex)   Cough in adult    This could be post infectious cough, which we discussed could last for 8 weeks  Recommended warm tea with honey and continued OTC medications for symptom management  Will also order CXR and have her take albuterol inhaler PRN for coughing fits, she was coughing throughout exam, can consider atypical PNA       Relevant Orders   DG Chest 2 View   Annual physical exam - Primary    CMP Hep C  HIV  Lipid panel  UTD on colonoscopy  Will discuss Shingrix vaccine with Rheum first as she is on Xeljanz treatment        Loud snoring    Possible OSA  Will submit referral for home sleep study and follow up with results once available  Neck circumference 43cm       Relevant Orders   Ambulatory referral to Sleep Studies   Excessive daytime sleepiness    Referral for sleep study to evaluate for OSA       Relevant Orders   Ambulatory referral to Sleep Studies     No follow-ups on file.    I, [name], MD, have reviewed all documentation for this visit. The documentation on 10/11/22 for the exam, diagnosis, procedures, and orders are all accurate and complete.  Portions of this information were  initially documented by the CMA and reviewed by me for thoroughness and accuracy.      [name], MD  [facility] [phone] (phone) [phone] (fax)  [place]

## 2022-10-11 DIAGNOSIS — R0683 Snoring: Secondary | ICD-10-CM | POA: Insufficient documentation

## 2022-10-11 DIAGNOSIS — G4719 Other hypersomnia: Secondary | ICD-10-CM | POA: Insufficient documentation

## 2022-10-11 NOTE — Assessment & Plan Note (Signed)
CMP Hep C  HIV  Lipid panel  UTD on colonoscopy  Will discuss Shingrix vaccine with Rheum first as she is on [place] treatment

## 2022-10-11 NOTE — Assessment & Plan Note (Signed)
Referral for sleep study to evaluate for OSA

## 2022-10-11 NOTE — Assessment & Plan Note (Signed)
Possible OSA  Will submit referral for home sleep study and follow up with results once available  Neck circumference 43cm

## 2022-10-11 NOTE — Assessment & Plan Note (Signed)
Hep C screening collected

## 2022-10-11 NOTE — Assessment & Plan Note (Signed)
This could be post infectious cough, which we discussed could last for 8 weeks  Recommended warm tea with honey and continued OTC medications for symptom management  Will also order CXR and have her take albuterol inhaler PRN for coughing fits, she was coughing throughout exam, can consider atypical PNA

## 2022-10-11 NOTE — Assessment & Plan Note (Signed)
Will check lipid panel  Continue rosuvastatin 10mg  daily

## 2022-10-11 NOTE — Assessment & Plan Note (Signed)
We will recheck Vitamin B12 levels today

## 2022-10-11 NOTE — Assessment & Plan Note (Signed)
Agreeable to HIV screening today

## 2022-10-11 NOTE — Assessment & Plan Note (Signed)
Will check Vitamin D levels  Not currently on supplementation

## 2022-10-11 NOTE — Assessment & Plan Note (Signed)
Stable  Follows with rheumatology  On Harriette Ohara

## 2022-10-14 LAB — COMPREHENSIVE METABOLIC PANEL
ALT: 21 IU/L (ref 0–32)
AST: 22 IU/L (ref 0–40)
Albumin/Globulin Ratio: 1.5 (ref 1.2–2.2)
Albumin: 4.6 g/dL (ref 3.8–4.9)
Alkaline Phosphatase: 77 IU/L (ref 44–121)
BUN/Creatinine Ratio: 12 (ref 9–23)
BUN: 9 mg/dL (ref 6–24)
Bilirubin Total: 0.9 mg/dL (ref 0.0–1.2)
CO2: 23 mmol/L (ref 20–29)
Calcium: 9.9 mg/dL (ref 8.7–10.2)
Chloride: 101 mmol/L (ref 96–106)
Creatinine, Ser: 0.77 mg/dL (ref 0.57–1.00)
Globulin, Total: 3.1 g/dL (ref 1.5–4.5)
Glucose: 101 mg/dL — ABNORMAL HIGH (ref 70–99)
Potassium: 4.5 mmol/L (ref 3.5–5.2)
Sodium: 140 mmol/L (ref 134–144)
Total Protein: 7.7 g/dL (ref 6.0–8.5)
eGFR: 92 mL/min/{1.73_m2} (ref >59)

## 2022-10-14 LAB — LIPID PANEL
Chol/HDL Ratio: 1.9 ratio (ref 0.0–4.4)
Cholesterol, Total: 146 mg/dL (ref 100–199)
HDL: 76 mg/dL (ref >39)
LDL Chol Calc (NIH): 58 mg/dL (ref 0–99)
Triglycerides: 55 mg/dL (ref 0–149)
VLDL Cholesterol Cal: 12 mg/dL (ref 5–40)

## 2022-10-14 LAB — VITAMIN B12: Vitamin B-12: >2000 pg/mL — ABNORMAL HIGH (ref 232–1245)

## 2022-10-14 LAB — HEPATITIS C ANTIBODY: Hep C Virus Ab: NONREACTIVE

## 2022-10-14 LAB — VITAMIN D 25 HYDROXY (VIT D DEFICIENCY, FRACTURES): Vit D, 25-Hydroxy: 66.1 ng/mL (ref 30.0–100.0)

## 2022-10-14 LAB — HIV ANTIBODY (ROUTINE TESTING W REFLEX): HIV Screen 4th Generation wRfx: NONREACTIVE

## 2022-10-22 ENCOUNTER — Other Ambulatory Visit: Payer: Self-pay | Admitting: Family Medicine

## 2022-11-11 NOTE — Progress Notes (Signed)
I,Joseline E Rosas,acting as a scribe for [employer], MD.,have documented all relevant documentation on the behalf of [name], MD,as directed by  [name], MD while in the presence of [name], MD.   Established patient visit   Patient: Carolyn Hays   DOB: 05-03-1971   51 y.o. Female  MRN: [id number] Visit Date: 11/13/2022  Today's healthcare provider: [name], MD   Chief Complaint  Patient presents with   Follow-up   Subjective    HPI  Elevated Blood Pressure: Patient here for follow-up of elevated blood pressure. Cardiac symptoms fatigue. Patient denies chest pain, chest pressure/discomfort, exertional chest pressure/discomfort, irregular heart beat, lower extremity edema, near-syncope, and palpitations.  BP's at home 110's-130's/70-80's. Patient was seen at the rheumatologist this AM was 104/82.  BP Readings from Last 3 Encounters:  11/13/22 (!) 140/88  10/09/22 (!) 141/91  07/03/22 128/84     Sleep Apnea Concerns  Patient is asking about her sleep study. She reports that she was told she would be billed if they did not receive the equipment. Review of her chart shows that equipment was shipped on 10/15/22 and she reports completing the home sleep study after Thanksgiving and mailed the equipment on 10/21/22. She would like to know if the returned equipment was received and if the report is available.   Patient discussed both shingles and PnA vaccine with her specialist who recommended both. She plans to receive at the pharmacy.  Medications: Outpatient Medications Prior to Visit  Medication Sig   cetirizine (ZYRTEC) 10 MG tablet Take 10 mg by mouth.   Cholecalciferol (VITAMIN D3) 2000 UNITS capsule Take by mouth.   DULoxetine (CYMBALTA) 30 MG capsule Take 30 mg by mouth daily.   Magnesium 100 MG CAPS Take 400 mg by mouth.   Omega-3 Fatty Acids (FISH OIL PO) Take 2,000 mg by mouth 2 (two)  times daily.   omeprazole (PRILOSEC) 20 MG capsule Take 20 mg by mouth daily.   rosuvastatin (CRESTOR) 10 MG tablet Take 1 tablet (10 mg total) by mouth daily.   Tofacitinib Citrate 5 MG TABS Xeljanz 5 mg tablet  TAKE ONE TABLET BY MOUTH TWICE DAILY. MAY BE TAKEN WITH OR WITHOUT FOOD. STORE AT ROOM TEMPERATURE.   [DISCONTINUED] albuterol (VENTOLIN HFA) 108 (90 Base) MCG/ACT inhaler Inhale 2 puffs into the lungs every 4 (four) hours as needed for wheezing or shortness of breath.   [DISCONTINUED] vitamin B-12 (CYANOCOBALAMIN) 1000 MCG tablet Take 2,500 mcg by mouth daily.   No facility-administered medications prior to visit.    Review of Systems     Objective    BP (!) 140/88 (BP Location: Left Arm, Patient Position: Sitting, Cuff Size: Large)   Pulse (!) 109   Temp 98.2 F (36.8 C) (Oral)   Resp 16   Wt 224 lb 1.6 oz (101.7 kg)   LMP 09/07/2015   BMI 36.17 kg/m    Physical Exam Vitals reviewed.  Constitutional:      General: She is not in acute distress.    Appearance: Normal appearance. She is not ill-appearing, toxic-appearing or diaphoretic.  Eyes:     Conjunctiva/sclera: Conjunctivae normal.  Cardiovascular:     Rate and Rhythm: Normal rate and regular rhythm.     Pulses: Normal pulses.     Heart sounds: Normal heart sounds. No murmur heard.    No friction rub. No gallop.  Pulmonary:     Effort: Pulmonary effort is normal. No respiratory distress.  Breath sounds: Normal breath sounds. No stridor. No wheezing, rhonchi or rales.  Abdominal:     General: Bowel sounds are normal. There is no distension.     Palpations: Abdomen is soft.     Tenderness: There is no abdominal tenderness.  Musculoskeletal:     Right lower leg: No edema.     Left lower leg: No edema.  Skin:    Findings: No erythema or rash.  Neurological:     Mental Status: She is alert and oriented to person, place, and time.      No results found for any visits on 11/13/22.   Assessment &  Plan     Problem List Items Addressed This Visit       Respiratory   Sleep apnea in adult    Home sleep study completed  Reviewed results with categorization of moderate -severe sleep apnea  Will submit order for CPAP machine based on results  Results to be scanned into patient's chart         Other   Insomnia - Primary    Hydroxyzine 25mg  at bedtime trial  Follow up in 6 weeks       Elevated blood pressure reading    Recommended monitoring of BP  No medications recommended based on normal BP with previous records         Return in about 6 weeks (around 12/25/2022) for insomnia.     I, 02/23/2023, MD, have reviewed all documentation for this visit.  Portions of this information were initially documented by the CMA and reviewed by me for thoroughness and accuracy.      Ronnald Ramp, MD  [facility] [phone] (phone) [phone] (fax)  [facility]

## 2022-11-13 ENCOUNTER — Ambulatory Visit (INDEPENDENT_AMBULATORY_CARE_PROVIDER_SITE_OTHER): Payer: BC Managed Care – PPO | Admitting: Family Medicine

## 2022-11-13 ENCOUNTER — Encounter: Payer: Self-pay | Admitting: Family Medicine

## 2022-11-13 VITALS — BP 140/88 | HR 109 | Temp 98.2°F | Resp 16 | Wt 224.1 lb

## 2022-11-13 DIAGNOSIS — R030 Elevated blood-pressure reading, without diagnosis of hypertension: Secondary | ICD-10-CM | POA: Diagnosis not present

## 2022-11-13 DIAGNOSIS — G4730 Sleep apnea, unspecified: Secondary | ICD-10-CM

## 2022-11-13 DIAGNOSIS — G4700 Insomnia, unspecified: Secondary | ICD-10-CM

## 2022-11-13 MED ORDER — HYDROXYZINE PAMOATE 25 MG PO CAPS: 25.0000 mg | ORAL_CAPSULE | Freq: Every evening | ORAL | 0 refills | Status: DC | PRN

## 2022-11-13 NOTE — Patient Instructions (Signed)
Insomnia Insomnia is a sleep disorder that makes it difficult to fall asleep or stay asleep. Insomnia can cause fatigue, low energy, difficulty concentrating, mood swings, and poor performance at work or school. There are three different ways to classify insomnia: Difficulty falling asleep. Difficulty staying asleep. Waking up too early in the morning. Any type of insomnia can be long-term (chronic) or short-term (acute). Both are common. Short-term insomnia usually lasts for 3 months or less. Chronic insomnia occurs at least three times a week for longer than 3 months. What are the causes? Insomnia may be caused by another condition, situation, or substance, such as: Having certain mental health conditions, such as anxiety and depression. Using caffeine, alcohol, tobacco, or drugs. Having gastrointestinal conditions, such as gastroesophageal reflux disease (GERD). Having certain medical conditions. These include: Asthma. Alzheimer's disease. Stroke. Chronic pain. An overactive thyroid gland (hyperthyroidism). Other sleep disorders, such as restless legs syndrome and sleep apnea. Menopause. Sometimes, the cause of insomnia may not be known. What increases the risk? Risk factors for insomnia include: Gender. Females are affected more often than males. Age. Insomnia is more common as people get older. Stress and certain medical and mental health conditions. Lack of exercise. Having an irregular work schedule. This may include working night shifts and traveling between different time zones. What are the signs or symptoms? If you have insomnia, the main symptom is having trouble falling asleep or having trouble staying asleep. This may lead to other symptoms, such as: Feeling tired or having low energy. Feeling nervous about going to sleep. Not feeling rested in the morning. Having trouble concentrating. Feeling irritable, anxious, or depressed. How is this diagnosed? This condition  may be diagnosed based on: Your symptoms and medical history. Your health care provider may ask about: Your sleep habits. Any medical conditions you have. Your mental health. A physical exam. How is this treated? Treatment for insomnia depends on the cause. Treatment may focus on treating an underlying condition that is causing the insomnia. Treatment may also include: Medicines to help you sleep. Counseling or therapy. Lifestyle adjustments to help you sleep better. Follow these instructions at home: Eating and drinking  Limit or avoid alcohol, caffeinated beverages, and products that contain nicotine and tobacco, especially close to bedtime. These can disrupt your sleep. Do not eat a large meal or eat spicy foods right before bedtime. This can lead to digestive discomfort that can make it hard for you to sleep. Sleep habits  Keep a sleep diary to help you and your health care provider figure out what could be causing your insomnia. Write down: When you sleep. When you wake up during the night. How well you sleep and how rested you feel the next day. Any side effects of medicines you are taking. What you eat and drink. Make your bedroom a dark, comfortable place where it is easy to fall asleep. Put up shades or blackout curtains to block light from outside. Use a white noise machine to block noise. Keep the temperature cool. Limit screen use before bedtime. This includes: Not watching TV. Not using your smartphone, tablet, or computer. Stick to a routine that includes going to bed and waking up at the same times every day and night. This can help you fall asleep faster. Consider making a quiet activity, such as reading, part of your nighttime routine. Try to avoid taking naps during the day so that you sleep better at night. Get out of bed if you are still awake after   15 minutes of trying to sleep. Keep the lights down, but try reading or doing a quiet activity. When you feel  sleepy, go back to bed. General instructions Take over-the-counter and prescription medicines only as told by your health care provider. Exercise regularly as told by your health care provider. However, avoid exercising in the hours right before bedtime. Use relaxation techniques to manage stress. Ask your health care provider to suggest some techniques that may work well for you. These may include: Breathing exercises. Routines to release muscle tension. Visualizing peaceful scenes. Make sure that you drive carefully. Do not drive if you feel very sleepy. Keep all follow-up visits. This is important. Contact a health care provider if: You are tired throughout the day. You have trouble in your daily routine due to sleepiness. You continue to have sleep problems, or your sleep problems get worse. Get help right away if: You have thoughts about hurting yourself or someone else. Get help right away if you feel like you may hurt yourself or others, or have thoughts about taking your own life. Go to your nearest emergency room or: Call 911. Call the National Suicide Prevention Lifeline at 1-800-273-8255 or 988. This is open 24 hours a day. Text the Crisis Text Line at 741741. Summary Insomnia is a sleep disorder that makes it difficult to fall asleep or stay asleep. Insomnia can be long-term (chronic) or short-term (acute). Treatment for insomnia depends on the cause. Treatment may focus on treating an underlying condition that is causing the insomnia. Keep a sleep diary to help you and your health care provider figure out what could be causing your insomnia. This information is not intended to replace advice given to you by your health care provider. Make sure you discuss any questions you have with your health care provider. Document Revised: 10/21/2021 Document Reviewed: 10/21/2021 Elsevier Patient Education  2023 Elsevier Inc.  

## 2022-11-14 ENCOUNTER — Encounter: Payer: Self-pay | Admitting: Family Medicine

## 2022-11-17 DIAGNOSIS — R030 Elevated blood-pressure reading, without diagnosis of hypertension: Secondary | ICD-10-CM | POA: Insufficient documentation

## 2022-11-17 DIAGNOSIS — G4730 Sleep apnea, unspecified: Secondary | ICD-10-CM | POA: Insufficient documentation

## 2022-11-17 NOTE — Assessment & Plan Note (Signed)
Recommended monitoring of BP  No medications recommended based on normal BP with previous records

## 2022-11-17 NOTE — Assessment & Plan Note (Signed)
Home sleep study completed  Reviewed results with categorization of moderate -severe sleep apnea  Will submit order for CPAP machine based on results  Results to be scanned into patient's chart

## 2022-11-17 NOTE — Assessment & Plan Note (Signed)
Hydroxyzine 25mg  at bedtime trial  Follow up in 6 weeks

## 2022-12-03 ENCOUNTER — Telehealth: Payer: Self-pay

## 2022-12-03 NOTE — Telephone Encounter (Signed)
Copied from [place] [id number]. Topic: General - Other >> Dec 02, 2022  4:39 PM Cyndi Bender wrote: Reason for CRM: Larene Beach with [facility] stated the order needs to be sent to [place]. Cb# [phone]

## 2022-12-04 NOTE — Telephone Encounter (Signed)
Order send in

## 2022-12-12 NOTE — Progress Notes (Signed)
I,Joseline E Rosas,acting as a scribe for [employer], MD.,have documented all relevant documentation on the behalf of [name], MD,as directed by  [name], MD while in the presence of [name], MD.    Established patient visit   Patient: Carolyn Hays   DOB: 12/07/1970   52 y.o. Female  MRN: [id number] Visit Date: 12/15/2022  Today's healthcare provider: [name], MD   Chief Complaint  Patient presents with  . Follow-up   Subjective    HPI  Follow up for insomnia  The patient was last seen for this 6 weeks ago. Changes made at last visit include Hydroxyzine 25mg  at bedtime trial .  She reports excellent compliance with treatment. She feels that condition is Improved. She is not having side effects.  Reports today that she is getting about 7 hours of sleep. She reports that she can easily return to sleep if she wakes  She has been taking the hydroxyzine but not every night    Sleep Apnea  CPAP she was using service that was becoming more expensive  She would like to look into purchasing froma different agency  She will plan to send message   Right ear pain  Has been bothering her since the beginning of Jan   -----------------------------------------------------------------------------------------   Health Updates  Patient just wanted [place] to know that she had Covid January 2,2024. She had mild symptoms.  Doing better now.  Medications: Outpatient Medications Prior to Visit  Medication Sig  . cetirizine (ZYRTEC) 10 MG tablet Take 10 mg by mouth.  . Cholecalciferol (VITAMIN D3) 2000 UNITS capsule Take by mouth.  . DULoxetine (CYMBALTA) 30 MG capsule Take 30 mg by mouth daily. Taking 35 mg  . hydrOXYzine (VISTARIL) 25 MG capsule Take 1 capsule (25 mg total) by mouth at bedtime as needed.  . Magnesium 100 MG CAPS Take 400 mg by mouth.  . Omega-3 Fatty Acids (FISH OIL PO) Take 2,000 mg by  mouth 2 (two) times daily.  Marland Kitchen omeprazole (PRILOSEC) 20 MG capsule Take 20 mg by mouth daily.  . rosuvastatin (CRESTOR) 10 MG tablet Take 1 tablet (10 mg total) by mouth daily.  . Tofacitinib Citrate 5 MG TABS Xeljanz 5 mg tablet  TAKE ONE TABLET BY MOUTH TWICE DAILY. MAY BE TAKEN WITH OR WITHOUT FOOD. STORE AT ROOM TEMPERATURE.   No facility-administered medications prior to visit.    Review of Systems  {Labs  Heme  Chem  Endocrine  Serology  Results Review (optional):23779}   Objective    BP 131/89 (BP Location: Left Arm, Patient Position: Sitting, Cuff Size: Large)   Pulse (!) 113   Temp 98.1 F (36.7 C) (Oral)   Resp 16   Wt 223 lb 3.2 oz (101.2 kg)   LMP 09/07/2015   BMI 36.03 kg/m  {Show previous vital signs (optional):23777}  Physical Exam  ***  No results found for any visits on 12/15/22.  Assessment & Plan     Problem List Items Addressed This Visit   None    No follow-ups on file.        The entirety of the information documented in the History of Present Illness, Review of Systems and Physical Exam were personally obtained by me. Portions of this information were initially documented by *** and reviewed by me for thoroughness and accuracy.[place], CMA     [name], MD  [facility] [phone] (phone) [phone] (fax)  [facility]

## 2022-12-15 ENCOUNTER — Encounter: Payer: Self-pay | Admitting: Family Medicine

## 2022-12-15 ENCOUNTER — Ambulatory Visit (INDEPENDENT_AMBULATORY_CARE_PROVIDER_SITE_OTHER): Payer: BC Managed Care – PPO | Admitting: Family Medicine

## 2022-12-15 VITALS — BP 131/89 | HR 113 | Temp 98.1°F | Resp 16 | Wt 223.2 lb

## 2022-12-15 DIAGNOSIS — H9201 Otalgia, right ear: Secondary | ICD-10-CM | POA: Diagnosis not present

## 2022-12-15 DIAGNOSIS — G4700 Insomnia, unspecified: Secondary | ICD-10-CM | POA: Diagnosis not present

## 2022-12-15 DIAGNOSIS — G4730 Sleep apnea, unspecified: Secondary | ICD-10-CM | POA: Diagnosis not present

## 2022-12-17 DIAGNOSIS — H9201 Otalgia, right ear: Secondary | ICD-10-CM | POA: Insufficient documentation

## 2022-12-17 NOTE — Assessment & Plan Note (Signed)
No signs of acute otitis media or externa on today's exam  Recommended OTC ear drops for analgesia  Recommended RTC if symptoms become more severe, patient was agreeable

## 2022-12-17 NOTE — Assessment & Plan Note (Signed)
Patient is planning to purchase her own machine  She reports previous agency was not cost efficient  She will plan to message the office once she has found a reasonably priced option for cpap machine

## 2022-12-17 NOTE — Assessment & Plan Note (Signed)
Improved  Symptoms well managed with PRN hydroxyzine 25mg   Continue current regimen including sleep hygiene methods previously discussed

## 2022-12-28 ENCOUNTER — Other Ambulatory Visit: Payer: Self-pay | Admitting: Physician Assistant

## 2022-12-28 DIAGNOSIS — E785 Hyperlipidemia, unspecified: Secondary | ICD-10-CM

## 2023-04-13 ENCOUNTER — Other Ambulatory Visit: Payer: Self-pay | Admitting: Family Medicine

## 2023-04-13 DIAGNOSIS — E785 Hyperlipidemia, unspecified: Secondary | ICD-10-CM

## 2023-05-15 ENCOUNTER — Telehealth: Payer: Self-pay

## 2023-05-15 NOTE — Telephone Encounter (Signed)
Contacted number. Left voicemail to return call.  Reviewed order on parachute and signed all pending order under Dr.Robinson

## 2023-05-15 NOTE — Telephone Encounter (Signed)
Copied from CRM [id number]. Topic: General - Other >> May 15, 2023 12:12 PM Franchot Heidelberg wrote: Reason for CRM: Call back request from [place] with [facility] for Carolyn Hays.   Regarding pending resupply signatures, call back request   Best contact: [phone]

## 2023-05-26 NOTE — Progress Notes (Signed)
      Established patient visit   Patient: Carolyn Hays   DOB: 11-15-1971   52 y.o. Female  MRN: [id number] Visit Date: 05/27/2023  Today's healthcare provider: Ronnald Ramp, MD   No chief complaint on file.  Subjective      Flight Anxiety  ***   Medications: Outpatient Medications Prior to Visit  Medication Sig   cetirizine (ZYRTEC) 10 MG tablet Take 10 mg by mouth.   Cholecalciferol (VITAMIN D3) 2000 UNITS capsule Take by mouth.   DULoxetine (CYMBALTA) 30 MG capsule Take 30 mg by mouth daily. Taking 35 mg   hydrOXYzine (VISTARIL) 25 MG capsule Take 1 capsule (25 mg total) by mouth at bedtime as needed.   Magnesium 100 MG CAPS Take 400 mg by mouth.   Omega-3 Fatty Acids (FISH OIL PO) Take 2,000 mg by mouth 2 (two) times daily.   omeprazole (PRILOSEC) 20 MG capsule Take 20 mg by mouth daily.   rosuvastatin (CRESTOR) 10 MG tablet TAKE 1 TABLET(10 MG) BY MOUTH DAILY   Tofacitinib Citrate 5 MG TABS Xeljanz 5 mg tablet  TAKE ONE TABLET BY MOUTH TWICE DAILY. MAY BE TAKEN WITH OR WITHOUT FOOD. STORE AT ROOM TEMPERATURE.   No facility-administered medications prior to visit.    Review of Systems  {Labs  Heme  Chem  Endocrine  Serology  Results Review (optional):23779}   Objective    LMP 09/07/2015  {Show previous vital signs (optional):23777}  Physical Exam  ***  No results found for any visits on 05/27/23.  Assessment & Plan     Problem List Items Addressed This Visit   None    No follow-ups on file.         Ronnald Ramp, MD  [facility] [phone] (phone) [phone] (fax)  [facility]

## 2023-05-27 ENCOUNTER — Ambulatory Visit (INDEPENDENT_AMBULATORY_CARE_PROVIDER_SITE_OTHER): Payer: BC Managed Care – PPO | Admitting: Family Medicine

## 2023-05-27 ENCOUNTER — Encounter: Payer: Self-pay | Admitting: Family Medicine

## 2023-05-27 VITALS — BP 131/84 | HR 94 | Temp 97.8°F | Resp 12 | Ht 66.0 in | Wt 224.1 lb

## 2023-05-27 DIAGNOSIS — F418 Other specified anxiety disorders: Secondary | ICD-10-CM

## 2023-05-27 MED ORDER — LORAZEPAM 1 MG PO TABS: 1.0000 mg | ORAL_TABLET | Freq: Two times a day (BID) | ORAL | 0 refills | Status: DC | PRN

## 2023-05-27 NOTE — Assessment & Plan Note (Signed)
Chronic problem Upcoming flight for several hours Prescribed Ativan 1 mg twice daily as needed during flight 12 tablets prescribed with 0 refills Patient to return as needed for anxiety Counseled patient to schedule complete physical exam in November 2024

## 2023-07-09 ENCOUNTER — Other Ambulatory Visit: Payer: Self-pay | Admitting: Family Medicine

## 2023-07-09 DIAGNOSIS — E785 Hyperlipidemia, unspecified: Secondary | ICD-10-CM

## 2023-07-10 NOTE — Telephone Encounter (Signed)
Requested Prescriptions  Pending Prescriptions Disp Refills   rosuvastatin (CRESTOR) 10 MG tablet [Pharmacy Med Name: ROSUVASTATIN 10MG  TABLETS] 90 tablet 0    Sig: TAKE 1 TABLET(10 MG) BY MOUTH DAILY     Cardiovascular:  Antilipid - Statins 2 Failed - 07/09/2023  3:24 AM      Failed - Lipid Panel in normal range within the last 12 months    Cholesterol, Total  Date Value Ref Range Status  10/13/2022 146 100 - 199 mg/dL Final   LDL Chol Calc (NIH)  Date Value Ref Range Status  10/13/2022 58 0 - 99 mg/dL Final   HDL  Date Value Ref Range Status  10/13/2022 76 >39 mg/dL Final   Triglycerides  Date Value Ref Range Status  10/13/2022 55 0 - 149 mg/dL Final         Passed - Cr in normal range and within 360 days    Creatinine, Ser  Date Value Ref Range Status  10/13/2022 0.77 0.57 - 1.00 mg/dL Final         Passed - Patient is not pregnant      Passed - Valid encounter within last 12 months    Recent Outpatient Visits           1 month ago Situational anxiety   [facility] Simmons-Robinson, [place], MD   6 months ago Insomnia, unspecified type   [place] [facility] Simmons-Robinson, [place], MD   7 months ago Insomnia, unspecified type   [place] [facility] Simmons-Robinson, [place], MD   9 months ago Annual physical exam   [place] [facility] [place], [place], MD   1 year ago Neck pain    [facility] Bosie Clos, MD       Future Appointments             In 3 months Simmons-Robinson, Tawanna Cooler, MD [facility]

## 2023-07-13 ENCOUNTER — Encounter: Payer: Self-pay | Admitting: Family Medicine

## 2023-07-13 ENCOUNTER — Other Ambulatory Visit: Payer: Self-pay | Admitting: Family Medicine

## 2023-07-13 DIAGNOSIS — E785 Hyperlipidemia, unspecified: Secondary | ICD-10-CM

## 2023-10-12 ENCOUNTER — Encounter: Payer: BC Managed Care – PPO | Admitting: Family Medicine

## 2023-11-30 ENCOUNTER — Encounter: Payer: Self-pay | Admitting: Family Medicine

## 2023-11-30 ENCOUNTER — Other Ambulatory Visit (HOSPITAL_COMMUNITY)
Admission: RE | Admit: 2023-11-30 | Discharge: 2023-11-30 | Disposition: A | Discharge status: Home / Self Care | Payer: 59 | Source: Ambulatory Visit | Attending: Family Medicine | Admitting: Family Medicine

## 2023-11-30 ENCOUNTER — Ambulatory Visit (INDEPENDENT_AMBULATORY_CARE_PROVIDER_SITE_OTHER): Payer: 59 | Admitting: Family Medicine

## 2023-11-30 VITALS — BP 133/95 | Ht 66.5 in | Wt 228.0 lb

## 2023-11-30 DIAGNOSIS — E7800 Pure hypercholesterolemia, unspecified: Secondary | ICD-10-CM | POA: Diagnosis not present

## 2023-11-30 DIAGNOSIS — Z0001 Encounter for general adult medical examination with abnormal findings: Secondary | ICD-10-CM | POA: Diagnosis not present

## 2023-11-30 DIAGNOSIS — M5412 Radiculopathy, cervical region: Secondary | ICD-10-CM

## 2023-11-30 DIAGNOSIS — Z0000 Encounter for general adult medical examination without abnormal findings: Secondary | ICD-10-CM

## 2023-11-30 DIAGNOSIS — E559 Vitamin D deficiency, unspecified: Secondary | ICD-10-CM | POA: Diagnosis not present

## 2023-11-30 DIAGNOSIS — F411 Generalized anxiety disorder: Secondary | ICD-10-CM

## 2023-11-30 DIAGNOSIS — Z6837 Body mass index (BMI) 37.0-37.9, adult: Secondary | ICD-10-CM

## 2023-11-30 DIAGNOSIS — K219 Gastro-esophageal reflux disease without esophagitis: Secondary | ICD-10-CM

## 2023-11-30 DIAGNOSIS — E66812 Obesity, class 2: Secondary | ICD-10-CM

## 2023-11-30 DIAGNOSIS — Z131 Encounter for screening for diabetes mellitus: Secondary | ICD-10-CM

## 2023-11-30 DIAGNOSIS — Z124 Encounter for screening for malignant neoplasm of cervix: Secondary | ICD-10-CM

## 2023-11-30 DIAGNOSIS — E538 Deficiency of other specified B group vitamins: Secondary | ICD-10-CM

## 2023-11-30 DIAGNOSIS — G63 Polyneuropathy in diseases classified elsewhere: Secondary | ICD-10-CM

## 2023-11-30 DIAGNOSIS — G4730 Sleep apnea, unspecified: Secondary | ICD-10-CM

## 2023-11-30 DIAGNOSIS — E785 Hyperlipidemia, unspecified: Secondary | ICD-10-CM

## 2023-11-30 DIAGNOSIS — Z1211 Encounter for screening for malignant neoplasm of colon: Secondary | ICD-10-CM

## 2023-11-30 MED ORDER — ROSUVASTATIN CALCIUM 10 MG PO TABS: 10.0000 mg | ORAL_TABLET | Freq: Every day | ORAL | 3 refills | Status: DC

## 2023-11-30 NOTE — Patient Instructions (Addendum)
 It was a pleasure to see you today!  Thank you for choosing [facility] for your primary care.   Today you were seen for your annual physical  Please review the attached information regarding helpful preventive health topics.    A referral has been placed on your behalf for neurosurgery. Our referral coordination team or the office you will be visiting will contact you within the next 2 weeks.  If you have not received a phone call within 10 business days please let us  know so that we can check into this for you.   To keep you healthy, please keep in mind the following health maintenance items that you are due for:    Shingrix vaccine  Influenza vaccine  Colon cancer screening   Please make sure to schedule your next annual physical for one year from today.   Best Wishes,   Dr. Lang

## 2023-11-30 NOTE — Progress Notes (Signed)
 Complete physical exam   Patient: Carolyn Hays   DOB: 05-30-1971   53 y.o. Female  MRN: [id number] Visit Date: 11/30/2023  Today's healthcare provider: Rockie Agent, MD   Chief Complaint  Patient presents with   Annual Exam   Subjective    Carolyn Hays is a 53 y.o. female who presents today for a complete physical exam.   She reports consuming a general diet.    Walking inconsistently right now but goal is 4 days per week     She generally feels well.   She reports sleeping well.    She does have additional problems to discuss today.   Discussed the use of AI scribe software for clinical note transcription with the patient, who gave verbal consent to proceed.  History of Present Illness   The patient, a 53 year old individual, presents for an annual physical. She has a notable history of varicose veins, sleep apnea, anxiety, rheumatoid arthritis managed by rheumatology, inflammatory spondylopathy, vitamin D  deficiency, and acid reflux. She is currently on Xeljanz 5 mg twice daily for rheumatoid arthritis. She also has hyperlipidemia, managed with Crestor  10 mg daily, with the last total cholesterol recorded as 146 in November 2023.  The patient reports struggling with dietary choices and inconsistent exercise habits, despite understanding the importance of a balanced diet and regular physical activity. She expresses a desire to achieve the recommended 150 minutes of exercise per week and has been attempting to increase her walking and stretching routines. She also mentions a recent weight loss over the Christmas period due to an irregular eating schedule.  The patient reports a history of cervical spine issues, with recent worsening of neck pain since October. The pain is now radiating down to the thumb, causing tingling and occasional difficulty in coordinating movements. Despite attempts at self-management with exercises from a physical therapy book and  regular ibuprofen  use, the pain persists and is not relieved by rest or changes in position.  The patient also reports discomfort during Pap smears, localized to the external opening of the vagina. This discomfort has been present since the birth of her son, delivered via C-section. Despite a previous outpatient surgery aimed at alleviating the discomfort, the issue persists. The patient also mentions a history of irritation from certain soaps, which has since improved after switching to a sensitive skin product.         Past Medical History:  Diagnosis Date   Allergic rhinitis    Alopecia    Anxiety    Arthritis    Calculus of kidney    Carpal tunnel syndrome    Depression    Dyspareunia, female    Eczema    Family history of breast cancer    Neg BRCA/BART 2014; Neg MyRisk update 10/18; riskscore=19.2%/IBIS=19.1%   Family history of ovarian cancer    paternal aunt in her 62s   GERD (gastroesophageal reflux disease)    IBS (irritable bowel syndrome)    Vitamin B12 deficiency neuropathy (HCC)    Vitamin D  deficiency    Past Surgical History:  Procedure Laterality Date   BARTHOLIN GLAND CYST EXCISION  2007   CESAREAN SECTION  10/27/2010   CHOLECYSTECTOMY  10/2006   COLONOSCOPY  2012   one polyp ([facility])   Introital revision  10/26/2015   OTHER SURGICAL HISTORY  2020   BILATERAL VERICOSE VEIN TREATMENT VIA LASER.   Social History   Socioeconomic History   Marital status: Married  Spouse name: Not on file   Number of children: 1   Years of education: Not on file   Highest education level: Bachelor's degree (e.g., BA, AB, BS)  Occupational History   Occupation: Runner, Broadcasting/film/video  Tobacco Use   Smoking status: Never   Smokeless tobacco: Never  Vaping Use   Vaping status: Never Used  Substance and Sexual Activity   Alcohol use: Not Currently    Alcohol/week: 0.0 - 1.0 standard drinks of alcohol    Comment: occasional   Drug use: No   Sexual activity: Not  Currently    Birth control/protection: Post-menopausal  Other Topics Concern   Not on file  Social History Narrative   Not on file   Social Drivers of Health   Financial Resource Strain: Low Risk  (05/26/2023)   Overall Financial Resource Strain (CARDIA)    Difficulty of Paying Living Expenses: Not hard at all  Food Insecurity: No Food Insecurity (05/26/2023)   Hunger Vital Sign    Worried About Running Out of Food in the Last Year: Never true    Ran Out of Food in the Last Year: Never true  Transportation Needs: No Transportation Needs (05/26/2023)   PRAPARE - Administrator, Civil Service (Medical): No    Lack of Transportation (Non-Medical): No  Physical Activity: Sufficiently Active (05/26/2023)   Exercise Vital Sign    Days of Exercise per Week: 3 days    Minutes of Exercise per Session: 50 min  Stress: No Stress Concern Present (05/26/2023)   [employer] of Occupational Health - Occupational Stress Questionnaire    Feeling of Stress : Only a little  Social Connections: Moderately Integrated (05/26/2023)   Social Connection and Isolation Panel [NHANES]    Frequency of Communication with Friends and Family: Once a week    Frequency of Social Gatherings with Friends and Family: Three times a week    Attends Religious Services: 1 to 4 times per year    Active Member of Clubs or Organizations: No    Attends Engineer, Structural: Not on file    Marital Status: Married  Catering Manager Violence: Not on file   Family Status  Relation Name Status   Mother  Alive   Father  Deceased       colon cancer   Sister  Alive   Brother  Alive   Son  Alive   MGF  (Not Specified)   PGF  Alive   PGM  Deceased   Pat Aunt  Deceased   Other MGrAunt Deceased   Mat Uncle  (Not Specified)   Bruna Brigham  (Not Specified)   Bruna Brigham  (Not Specified)  No partnership data on file   Family History  Problem Relation Age of Onset   Hypertension Mother    Diabetes Mother     Sleep apnea Mother    Hyperlipidemia Father    Sleep apnea Father    Colon cancer Father 77   Heart attack Father    Hypertension Father    Healthy Sister    Hypertension Brother    Cancer Maternal Grandfather        lung cancer in his 42s   Kidney disease Paternal Grandfather    Breast cancer Paternal Grandmother 15   Diabetes Paternal Aunt    Ovarian cancer Paternal Aunt 64   Lung cancer Other    Pancreatic cancer Other    Cancer Maternal Uncle 58       tongue  Prostate cancer Paternal Uncle        52s   Prostate cancer Paternal Uncle        52s   Allergies  Allergen Reactions   Indomethacin Other (See Comments)    severe H/As severe H/As     Medications: Outpatient Medications Prior to Visit  Medication Sig   cetirizine (ZYRTEC) 10 MG tablet Take 10 mg by mouth.   Cholecalciferol (VITAMIN D3) 2000 UNITS capsule Take by mouth.   DULoxetine  (CYMBALTA ) 30 MG capsule Take 30 mg by mouth daily. Taking 35 mg   gabapentin  (NEURONTIN ) 100 MG capsule Take 100 mg by mouth as needed.   Magnesium  100 MG CAPS Take 400 mg by mouth.   Omega-3 Fatty Acids (FISH OIL PO) Take 2,000 mg by mouth 2 (two) times daily.   omeprazole (PRILOSEC) 20 MG capsule Take 20 mg by mouth daily.   Tofacitinib Citrate 5 MG TABS Xeljanz 5 mg tablet  TAKE ONE TABLET BY MOUTH TWICE DAILY. MAY BE TAKEN WITH OR WITHOUT FOOD. STORE AT ROOM TEMPERATURE.   [DISCONTINUED] hydrOXYzine  (VISTARIL ) 25 MG capsule Take 1 capsule (25 mg total) by mouth at bedtime as needed.   [DISCONTINUED] LORazepam  (ATIVAN ) 1 MG tablet Take 1 tablet (1 mg total) by mouth 2 (two) times daily as needed for anxiety.   [DISCONTINUED] rosuvastatin  (CRESTOR ) 10 MG tablet TAKE 1 TABLET(10 MG) BY MOUTH DAILY (Patient not taking: Reported on 11/30/2023)   No facility-administered medications prior to visit.    Review of Systems  Last CBC Lab Results  Component Value Date   WBC 5.1 11/30/2023   HGB 12.3 11/30/2023   HCT 36.9 11/30/2023    MCV 90 11/30/2023   MCH 30.1 11/30/2023   RDW 13.1 11/30/2023   PLT 387 11/30/2023   Last metabolic panel Lab Results  Component Value Date   GLUCOSE 99 11/30/2023   NA 138 11/30/2023   K 4.6 11/30/2023   CL 101 11/30/2023   CO2 22 11/30/2023   BUN 17 11/30/2023   CREATININE 0.83 11/30/2023   EGFR 84 11/30/2023   CALCIUM  9.9 11/30/2023   PROT 7.8 11/30/2023   ALBUMIN 4.5 11/30/2023   LABGLOB 3.3 11/30/2023   AGRATIO 1.5 10/13/2022   BILITOT 0.4 11/30/2023   ALKPHOS 73 11/30/2023   AST 29 11/30/2023   ALT 26 11/30/2023   Last lipids Lab Results  Component Value Date   CHOL 267 (H) 11/30/2023   HDL 80 11/30/2023   LDLCALC 169 (H) 11/30/2023   TRIG 105 11/30/2023   CHOLHDL 3.3 11/30/2023   Last hemoglobin A1c Lab Results  Component Value Date   HGBA1C 5.7 (H) 11/30/2023   Last thyroid functions Lab Results  Component Value Date   TSH 1.170 11/30/2023   Last vitamin D  Lab Results  Component Value Date   VD25OH 48.0 11/30/2023   Last vitamin B12 and Folate Lab Results  Component Value Date   VITAMINB12 527 11/30/2023       Objective    BP (!) 133/95   Ht 5' 6.5 (1.689 m)   Wt 228 lb (103.4 kg)   LMP 09/07/2015   SpO2 97%   BMI 36.25 kg/m  BP Readings from Last 3 Encounters:  11/30/23 (!) 133/95  05/27/23 131/84  12/15/22 131/89   Wt Readings from Last 3 Encounters:  11/30/23 228 lb (103.4 kg)  05/27/23 224 lb 1.6 oz (101.7 kg)  12/15/22 223 lb 3.2 oz (101.2 kg)  Physical Exam Vitals reviewed.  Constitutional:      General: She is not in acute distress.    Appearance: Normal appearance. She is not ill-appearing, toxic-appearing or diaphoretic.  HENT:     Head: Normocephalic and atraumatic.     Right Ear: Tympanic membrane and external ear normal. There is no impacted cerumen.     Left Ear: Tympanic membrane and external ear normal. There is no impacted cerumen.     Nose: Nose normal.     Mouth/Throat:     Pharynx:  Oropharynx is clear.  Eyes:     General: No scleral icterus.    Extraocular Movements: Extraocular movements intact.     Conjunctiva/sclera: Conjunctivae normal.     Pupils: Pupils are equal, round, and reactive to light.  Neck:     Thyroid: Thyroid mass present. No thyroid tenderness.     Comments: Limited ROM 2/2 to left sided cervical spine pain  Cardiovascular:     Rate and Rhythm: Normal rate and regular rhythm.     Pulses: Normal pulses.     Heart sounds: Normal heart sounds. No murmur heard.    No friction rub. No gallop.  Pulmonary:     Effort: Pulmonary effort is normal. No respiratory distress.     Breath sounds: Normal breath sounds. No wheezing, rhonchi or rales.  Abdominal:     General: Bowel sounds are normal. There is no distension.     Palpations: Abdomen is soft. There is no mass.     Tenderness: There is no abdominal tenderness. There is no guarding.  Musculoskeletal:        General: No deformity.     Cervical back: Normal range of motion and neck supple. No rigidity or crepitus. Pain with movement and muscular tenderness present. Normal range of motion.     Right lower leg: No edema.     Left lower leg: No edema.  Lymphadenopathy:     Cervical: No cervical adenopathy.  Skin:    General: Skin is warm.     Capillary Refill: Capillary refill takes less than 2 seconds.     Findings: No erythema or rash.  Neurological:     General: No focal deficit present.     Mental Status: She is alert and oriented to person, place, and time.     Motor: No weakness.     Gait: Gait normal.  Psychiatric:        Mood and Affect: Mood normal.        Behavior: Behavior normal.      Last depression screening scores    05/27/2023    3:32 PM 11/13/2022    1:38 PM 10/09/2022    4:14 PM  PHQ 2/9 Scores  PHQ - 2 Score 0 3 2  PHQ- 9 Score  11 7    Last fall risk screening    05/27/2023    3:32 PM  Fall Risk   Falls in the past year? 0  Number falls in past yr: 0  Injury  with Fall? 0  Risk for fall due to : No Fall Risks  Follow up Falls evaluation completed    Last Audit-C alcohol use screening    05/26/2023    9:45 AM  Alcohol Use Disorder Test (AUDIT)  1. How often do you have a drink containing alcohol? 1  2. How many drinks containing alcohol do you have on a typical day when you are drinking? 0  3. How often do you  have six or more drinks on one occasion? 0  AUDIT-C Score 1   A score of 3 or more in women, and 4 or more in men indicates increased risk for alcohol abuse, EXCEPT if all of the points are from question 1   Results for orders placed or performed in visit on 11/30/23  CBC  Result Value Ref Range   WBC 5.1 3.4 - 10.8 x10E3/uL   RBC 4.09 3.77 - 5.28 x10E6/uL   Hemoglobin 12.3 11.1 - 15.9 g/dL   Hematocrit 63.0 65.9 - 46.6 %   MCV 90 79 - 97 fL   MCH 30.1 26.6 - 33.0 pg   MCHC 33.3 31.5 - 35.7 g/dL   RDW 86.8 88.2 - 84.5 %   Platelets 387 150 - 450 x10E3/uL  Hemoglobin A1c  Result Value Ref Range   Hgb A1c MFr Bld 5.7 (H) 4.8 - 5.6 %   Est. average glucose Bld gHb Est-mCnc 117 mg/dL  RFE85+ZHQM  Result Value Ref Range   Glucose 99 70 - 99 mg/dL   BUN 17 6 - 24 mg/dL   Creatinine, Ser 9.16 0.57 - 1.00 mg/dL   eGFR 84 >40 fO/fpw/8.26   BUN/Creatinine Ratio 20 9 - 23   Sodium 138 134 - 144 mmol/L   Potassium 4.6 3.5 - 5.2 mmol/L   Chloride 101 96 - 106 mmol/L   CO2 22 20 - 29 mmol/L   Calcium  9.9 8.7 - 10.2 mg/dL   Total Protein 7.8 6.0 - 8.5 g/dL   Albumin 4.5 3.8 - 4.9 g/dL   Globulin, Total 3.3 1.5 - 4.5 g/dL   Bilirubin Total 0.4 0.0 - 1.2 mg/dL   Alkaline Phosphatase 73 44 - 121 IU/L   AST 29 0 - 40 IU/L   ALT 26 0 - 32 IU/L  TSH+T4F+T3Free  Result Value Ref Range   TSH 1.170 0.450 - 4.500 uIU/mL   T3, Free 3.2 2.0 - 4.4 pg/mL   Free T4 1.04 0.82 - 1.77 ng/dL  VITAMIN D  25 Hydroxy (Vit-D Deficiency, Fractures)  Result Value Ref Range   Vit D, 25-Hydroxy 48.0 30.0 - 100.0 ng/mL  Lipid panel  Result Value Ref  Range   Cholesterol, Total 267 (H) 100 - 199 mg/dL   Triglycerides 894 0 - 149 mg/dL   HDL 80 >60 mg/dL   VLDL Cholesterol Cal 18 5 - 40 mg/dL   LDL Chol Calc (NIH) 830 (H) 0 - 99 mg/dL   Chol/HDL Ratio 3.3 0.0 - 4.4 ratio  Vitamin B12  Result Value Ref Range   Vitamin B-12 527 232 - 1,245 pg/mL    Assessment & Plan    Routine Health Maintenance and Physical Exam  Immunization History  Administered Date(s) Administered   Hepatitis B 09/21/2001, 11/08/2001, 03/29/2002   Influenza, Seasonal, Injecte, Preservative Fre 10/10/2015   Influenza,inj,Quad PF,6+ Mos 07/22/2017   Influenza,inj,Quad PF,6-35 Mos 09/08/2016   Influenza-Unspecified 07/30/2019, 07/29/2022, 08/14/2023   Moderna Sars-Covid-2 Vaccination 02/23/2021   PFIZER(Purple Top)SARS-COV-2 Vaccination 02/15/2020   Tdap 06/08/2017   Unspecified SARS-COV-2 Vaccination 01/24/2020, 02/15/2020, 07/13/2020   Zoster, Unspecified 08/14/2023, 11/15/2023    Health Maintenance  Topic Date Due   Colonoscopy  Never done   Zoster Vaccines- Shingrix (1 of 2) 02/12/2019   COVID-19 Vaccine (6 - 2024-25 season) 07/26/2023   Cervical Cancer Screening (HPV/Pap Cotest)  11/11/2023   MAMMOGRAM  08/12/2024   DTaP/Tdap/Td (2 - Td or Tdap) 06/09/2027   INFLUENZA VACCINE  Completed   Hepatitis C  Screening  Completed   HIV Screening  Completed   HPV VACCINES  Aged Out    Problem List Items Addressed This Visit       Respiratory   Sleep apnea in adult     Digestive   Acid reflux   Relevant Orders   CBC (Completed)     Nervous and Auditory   Left cervical radiculopathy   Chronic neck pain with radiculopathy symptoms (tingling, weakness in left upper extremity) likely due to nerve compression from osteophytes and neuropyramidal narrowing. Symptoms exacerbated by poor posture and prolonged computer use. Previous physical therapy for neck pain. Discussed neurosurgery referral and pain management options. - Order cervical spine X-rays -  Refer to neurosurgery - Consider neuropathic pain management      Relevant Orders   Ambulatory referral to Neurosurgery   B12 neuropathy (HCC)   Relevant Orders   CBC (Completed)   Vitamin B12 (Completed)     Other   Hypercholesterolemia   Relevant Medications   rosuvastatin  (CRESTOR ) 10 MG tablet   Other Relevant Orders   Lipid panel (Completed)   Avitaminosis D   Relevant Orders   VITAMIN D  25 Hydroxy (Vit-D Deficiency, Fractures) (Completed)   Anxiety, generalized   Relevant Orders   TSH+T4F+T3Free (Completed)   Annual physical exam - Primary   Routine annual physical examination for a 53 year old. Recommended colonoscopy due to family history of colon cancer. Recommended Pap smear; last cytology in December 2019 was negative. Recommended Shingrix and influenza vaccines. - Recommend colonoscopy - Perform Pap smear - Recommended Shingrix vaccine - recommended influenza vaccine -recommend 150 mins of exercise weekly and diet balanced in fruits and vegetables       Other Visit Diagnoses       Screening for colon cancer       Relevant Orders   Ambulatory referral to Gastroenterology     Class 2 severe obesity due to excess calories with serious comorbidity and body mass index (BMI) of 37.0 to 37.9 in adult [facility])       Relevant Orders   Hemoglobin A1c (Completed)   CMP14+EGFR (Completed)     Screening for diabetes mellitus       Relevant Orders   Hemoglobin A1c (Completed)     Hyperlipidemia, unspecified hyperlipidemia type       Relevant Medications   rosuvastatin  (CRESTOR ) 10 MG tablet     Screening for cervical cancer       Relevant Orders   Cytology - PAP             Hyperlipidemia Managed with Crestor  10 mg daily. Last total cholesterol 146, HDL 76, triglycerides 55 (November 2023). Discussed Crestor 's impact on blood sugar levels given pre-diabetic range. - Refill Crestor  10 mg daily - Check lipid panel  Rheumatoid Arthritis Managed by rheumatology  with Xeljanz 5 mg twice daily. - Continue Xeljanz 5 mg twice daily - Follow-up with rheumatology  Inflammatory Spondylopathy Managed by rheumatology. - Follow-up with rheumatology  Vitamin D  Deficiency Vitamin D  deficiency. - Check vitamin D  levels  Anxiety Anxiety. - Continue current management  Acid Reflux Acid reflux. - Continue current management  General Health Maintenance None - Collect labs: CBC, hemoglobin A1c, thyroid levels, vitamin D , lipid panel, vitamin B12 - Recommend 150 minutes of exercise per week - Advise well-balanced diet - Schedule mammogram  Follow-up - Perform blood work - Schedule follow-up for Pap smear results - Schedule follow-up for neurosurgery referral - Schedule follow-up for mammogram.  Return in about 3 months (around 02/28/2024) for Cholesterol, HTN.       Rockie Agent, MD  [facility] [phone] (phone) [phone] (fax)  [facility]

## 2023-12-01 LAB — CMP14+EGFR
ALT: 26 [IU]/L (ref 0–32)
AST: 29 [IU]/L (ref 0–40)
Albumin: 4.5 g/dL (ref 3.8–4.9)
Alkaline Phosphatase: 73 [IU]/L (ref 44–121)
BUN/Creatinine Ratio: 20 (ref 9–23)
BUN: 17 mg/dL (ref 6–24)
Bilirubin Total: 0.4 mg/dL (ref 0.0–1.2)
CO2: 22 mmol/L (ref 20–29)
Calcium: 9.9 mg/dL (ref 8.7–10.2)
Chloride: 101 mmol/L (ref 96–106)
Creatinine, Ser: 0.83 mg/dL (ref 0.57–1.00)
Globulin, Total: 3.3 g/dL (ref 1.5–4.5)
Glucose: 99 mg/dL (ref 70–99)
Potassium: 4.6 mmol/L (ref 3.5–5.2)
Sodium: 138 mmol/L (ref 134–144)
Total Protein: 7.8 g/dL (ref 6.0–8.5)
eGFR: 84 mL/min/{1.73_m2} (ref >59)

## 2023-12-01 LAB — CBC
Hematocrit: 36.9 % (ref 34.0–46.6)
Hemoglobin: 12.3 g/dL (ref 11.1–15.9)
MCH: 30.1 pg (ref 26.6–33.0)
MCHC: 33.3 g/dL (ref 31.5–35.7)
MCV: 90 fL (ref 79–97)
Platelets: 387 10*3/uL (ref 150–450)
RBC: 4.09 x10E6/uL (ref 3.77–5.28)
RDW: 13.1 % (ref 11.7–15.4)
WBC: 5.1 10*3/uL (ref 3.4–10.8)

## 2023-12-01 LAB — LIPID PANEL
Chol/HDL Ratio: 3.3 {ratio} (ref 0.0–4.4)
Cholesterol, Total: 267 mg/dL — ABNORMAL HIGH (ref 100–199)
HDL: 80 mg/dL (ref >39)
LDL Chol Calc (NIH): 169 mg/dL — ABNORMAL HIGH (ref 0–99)
Triglycerides: 105 mg/dL (ref 0–149)
VLDL Cholesterol Cal: 18 mg/dL (ref 5–40)

## 2023-12-01 LAB — VITAMIN B12: Vitamin B-12: 527 pg/mL (ref 232–1245)

## 2023-12-01 LAB — TSH+T4F+T3FREE
Free T4: 1.04 ng/dL (ref 0.82–1.77)
T3, Free: 3.2 pg/mL (ref 2.0–4.4)
TSH: 1.17 u[IU]/mL (ref 0.450–4.500)

## 2023-12-01 LAB — VITAMIN D 25 HYDROXY (VIT D DEFICIENCY, FRACTURES): Vit D, 25-Hydroxy: 48 ng/mL (ref 30.0–100.0)

## 2023-12-01 LAB — HEMOGLOBIN A1C
Est. average glucose Bld gHb Est-mCnc: 117 mg/dL
Hgb A1c MFr Bld: 5.7 % — ABNORMAL HIGH (ref 4.8–5.6)

## 2023-12-01 NOTE — Assessment & Plan Note (Signed)
 Chronic neck pain with radiculopathy symptoms (tingling, weakness in left upper extremity) likely due to nerve compression from osteophytes and neuropyramidal narrowing. Symptoms exacerbated by poor posture and prolonged computer use. Previous physical therapy for neck pain. Discussed neurosurgery referral and pain management options. - Order cervical spine X-rays - Refer to neurosurgery - Consider neuropathic pain management

## 2023-12-01 NOTE — Assessment & Plan Note (Signed)
 Routine annual physical examination for a 53 year old. Recommended colonoscopy due to family history of colon cancer. Recommended Pap smear; last cytology in December 2019 was negative. Recommended Shingrix and influenza vaccines. - Recommend colonoscopy - Perform Pap smear - Recommended Shingrix vaccine - recommended influenza vaccine -recommend 150 mins of exercise weekly and diet balanced in fruits and vegetables

## 2023-12-02 ENCOUNTER — Encounter: Payer: Self-pay | Admitting: Family Medicine

## 2023-12-02 LAB — CYTOLOGY - PAP
Comment: NEGATIVE
Diagnosis: NEGATIVE
High risk HPV: NEGATIVE

## 2023-12-04 ENCOUNTER — Other Ambulatory Visit: Payer: Self-pay

## 2023-12-04 DIAGNOSIS — Z1231 Encounter for screening mammogram for malignant neoplasm of breast: Secondary | ICD-10-CM

## 2023-12-18 NOTE — Progress Notes (Deleted)
 Referring Physician:  Ronnald Ramp, MD [address] [place],  [place] [address]  Primary Physician:  Ronnald Ramp, MD  History of Present Illness: 12/18/2023*** Ms. Sherrice Creekmore has a history of OSA, IBS, B12 neuropathy, carpal tunnel syndrome, RA, and high cholesterol.   Referred by rheumatology at [facility]   Neck pain that radiated down left arm  Did she have EMG?***   Duration: *** Location: *** Quality: *** Severity: ***  Precipitating: aggravated by *** Modifying factors: made better by *** Weakness: none Timing: *** Bowel/Bladder Dysfunction: none  Conservative measures:  Physical therapy: has not participated in PT  Multimodal medical therapy including regular antiinflammatories: flexeril, Cymbalta, neurontin Injections: no epidural steroid injections  Past Surgery: none  Onell E Rodier has ***no symptoms of cervical myelopathy.  The symptoms are causing a significant impact on the patient's life.   Review of Systems:  A 10 point review of systems is negative, except for the pertinent positives and negatives detailed in the HPI.  Past Medical History: Past Medical History:  Diagnosis Date   Allergic rhinitis    Alopecia    Anxiety    Arthritis    Calculus of kidney    Carpal tunnel syndrome    Depression    Dyspareunia, female    Eczema    Family history of breast cancer    Neg BRCA/BART 2014; Neg MyRisk update 10/18; riskscore=19.2%/IBIS=19.1%   Family history of ovarian cancer    paternal aunt in her 64s   GERD (gastroesophageal reflux disease)    IBS (irritable bowel syndrome)    Vitamin B12 deficiency neuropathy (HCC)    Vitamin D deficiency     Past Surgical History: Past Surgical History:  Procedure Laterality Date   BARTHOLIN GLAND CYST EXCISION  2007   CESAREAN SECTION  10/27/2010   CHOLECYSTECTOMY  10/2006   COLONOSCOPY  2012   one polyp ([facility])   Introital revision  10/26/2015    OTHER SURGICAL HISTORY  2020   BILATERAL VERICOSE VEIN TREATMENT VIA LASER.    Allergies: Allergies as of 12/22/2023 - Review Complete 11/30/2023  Allergen Reaction Noted   Indomethacin Other (See Comments) 09/11/2015    Medications: Outpatient Encounter Medications as of 12/22/2023  Medication Sig   cetirizine (ZYRTEC) 10 MG tablet Take 10 mg by mouth.   Cholecalciferol (VITAMIN D3) 2000 UNITS capsule Take by mouth.   DULoxetine (CYMBALTA) 30 MG capsule Take 30 mg by mouth daily. Taking 35 mg   gabapentin (NEURONTIN) 100 MG capsule Take 100 mg by mouth as needed.   Magnesium 100 MG CAPS Take 400 mg by mouth.   Omega-3 Fatty Acids (FISH OIL PO) Take 2,000 mg by mouth 2 (two) times daily.   omeprazole (PRILOSEC) 20 MG capsule Take 20 mg by mouth daily.   rosuvastatin (CRESTOR) 10 MG tablet Take 1 tablet (10 mg total) by mouth daily.   Tofacitinib Citrate 5 MG TABS Xeljanz 5 mg tablet  TAKE ONE TABLET BY MOUTH TWICE DAILY. MAY BE TAKEN WITH OR WITHOUT FOOD. STORE AT ROOM TEMPERATURE.   No facility-administered encounter medications on file as of 12/22/2023.    Social History: Social History   Tobacco Use   Smoking status: Never   Smokeless tobacco: Never  Vaping Use   Vaping status: Never Used  Substance Use Topics   Alcohol use: Not Currently    Alcohol/week: 0.0 - 1.0 standard drinks of alcohol    Comment: occasional   Drug use: No  Family Medical History: Family History  Problem Relation Age of Onset   Hypertension Mother    Diabetes Mother    Sleep apnea Mother    Hyperlipidemia Father    Sleep apnea Father    Colon cancer Father 32   Heart attack Father    Hypertension Father    Healthy Sister    Hypertension Brother    Cancer Maternal Grandfather        lung cancer in his 13s   Kidney disease Paternal Grandfather    Breast cancer Paternal Grandmother 30   Diabetes Paternal Aunt    Ovarian cancer Paternal Aunt 71   Lung cancer Other    Pancreatic  cancer Other    Cancer Maternal Uncle 48       tongue   Prostate cancer Paternal Uncle        51s   Prostate cancer Paternal Uncle        61s    Physical Examination: There were no vitals filed for this visit.  General: Patient is well developed, well nourished, calm, collected, and in no apparent distress. Attention to examination is appropriate.  Respiratory: Patient is breathing without any difficulty.   NEUROLOGICAL:     Awake, alert, oriented to person, place, and time.  Speech is clear and fluent. Fund of knowledge is appropriate.   Cranial Nerves: Pupils equal round and reactive to light.  Facial tone is symmetric.    *** ROM of cervical spine *** pain *** posterior cervical tenderness. *** tenderness in bilateral trapezial region.   *** ROM of lumbar spine *** pain *** posterior lumbar tenderness.   No abnormal lesions on exposed skin.   Strength: Side Biceps Triceps Deltoid Interossei Grip Wrist Ext. Wrist Flex.  R 5 5 5 5 5 5 5   L 5 5 5 5 5 5 5    Side Iliopsoas Quads Hamstring PF DF EHL  R 5 5 5 5 5 5   L 5 5 5 5 5 5    Reflexes are ***2+ and symmetric at the biceps, brachioradialis, patella and achilles.   Hoffman's is absent.  Clonus is not present.   Bilateral upper and lower extremity sensation is intact to light touch.     Gait is normal.   ***No difficulty with tandem gait.    Medical Decision Making  Imaging: Cervical xrays dated 04/08/22:  FINDINGS: Loss of the normal cervical lordosis is noted which may be related to muscular spasm. Seven cervical segments are well visualized. Osteophytic changes are noted from C4-C7. Mild neural foraminal narrowing is noted at these levels as well. The odontoid is within normal limits. No soft tissue abnormality is seen. No fracture or dislocation is seen.   IMPRESSION: Degenerative changes without acute abnormality.     Electronically Signed   By: Alcide Clever M.D.   On: 04/09/2022 23:41  I have  personally reviewed the images and agree with the above interpretation.  Assessment and Plan: Ms. Upham is a pleasant 53 y.o. female has ***  Treatment options discussed with patient and following plan made:   - Order for physical therapy for *** spine ***. Patient to call to schedule appointment. *** - Continue current medications including ***. Reviewed dosing and side effects.  - Prescription for ***. Reviewed dosing and side effects. Take with food.  - Prescription for *** to take prn muscle spasms. Reviewed dosing and side effects. Discussed this can cause drowsiness.  - MRI of *** to further evaluate *** radiculopathy.  No improvement time or medications (***).  - Referral to PMR at [facility] to discuss possible *** injections.  - Will schedule phone visit to review MRI results once I get them back.   I spent a total of *** minutes in face-to-face and non-face-to-face activities related to this patient's care today including review of outside records, review of imaging, review of symptoms, physical exam, discussion of differential diagnosis, discussion of treatment options, and documentation.   Thank you for involving me in the care of this patient.   Drake Leach PA-C Dept. of Neurosurgery

## 2023-12-22 ENCOUNTER — Ambulatory Visit: Payer: 59 | Admitting: Orthopedic Surgery

## 2023-12-27 NOTE — Progress Notes (Deleted)
 Referring Physician:  Sharma Coyer, MD [address] [place],  [place] [address]  Primary Physician:  Sharma Coyer, MD  History of Present Illness: 12/27/2023*** [name] has a history of OSA, IBS, B12 neuropathy, carpal tunnel syndrome, RA, and high cholesterol.   Referred by rheumatology at [facility].   Neck pain that radiated down left arm  Did she have EMG?***   Duration: *** Location: *** Quality: *** Severity: ***  Precipitating: aggravated by *** Modifying factors: made better by *** Weakness: none Timing: *** Bowel/Bladder Dysfunction: none  Conservative measures:  Physical therapy: has not participated in PT  Multimodal medical therapy including regular antiinflammatories: flexeril , Cymbalta , neurontin  Injections: no epidural steroid injections  Past Surgery: none  Carolyn Hays has ***no symptoms of cervical myelopathy.  The symptoms are causing a significant impact on the patient's life.   Review of Systems:  A 10 point review of systems is negative, except for the pertinent positives and negatives detailed in the HPI.  Past Medical History: Past Medical History:  Diagnosis Date   Allergic rhinitis    Alopecia    Anxiety    Arthritis    Calculus of kidney    Carpal tunnel syndrome    Depression    Dyspareunia, female    Eczema    Family history of breast cancer    Neg BRCA/BART 2014; Neg MyRisk update 10/18; riskscore=19.2%/IBIS=19.1%   Family history of ovarian cancer    paternal aunt in her 49s   GERD (gastroesophageal reflux disease)    IBS (irritable bowel syndrome)    Vitamin B12 deficiency neuropathy (HCC)    Vitamin D  deficiency     Past Surgical History: Past Surgical History:  Procedure Laterality Date   BARTHOLIN GLAND CYST EXCISION  2007   CESAREAN SECTION  10/27/2010   CHOLECYSTECTOMY  10/2006   COLONOSCOPY  2012   one polyp ([facility])   Introital revision  10/26/2015    OTHER SURGICAL HISTORY  2020   BILATERAL VERICOSE VEIN TREATMENT VIA LASER.    Allergies: Allergies as of 12/31/2023 - Review Complete 11/30/2023  Allergen Reaction Noted   Indomethacin Other (See Comments) 09/11/2015    Medications: Outpatient Encounter Medications as of 12/31/2023  Medication Sig   cetirizine (ZYRTEC) 10 MG tablet Take 10 mg by mouth.   Cholecalciferol (VITAMIN D3) 2000 UNITS capsule Take by mouth.   DULoxetine  (CYMBALTA ) 30 MG capsule Take 30 mg by mouth daily. Taking 35 mg   gabapentin  (NEURONTIN ) 100 MG capsule Take 100 mg by mouth as needed.   Magnesium  100 MG CAPS Take 400 mg by mouth.   Omega-3 Fatty Acids (FISH OIL PO) Take 2,000 mg by mouth 2 (two) times daily.   omeprazole (PRILOSEC) 20 MG capsule Take 20 mg by mouth daily.   rosuvastatin  (CRESTOR ) 10 MG tablet Take 1 tablet (10 mg total) by mouth daily.   Tofacitinib Citrate 5 MG TABS Xeljanz 5 mg tablet  TAKE ONE TABLET BY MOUTH TWICE DAILY. MAY BE TAKEN WITH OR WITHOUT FOOD. STORE AT ROOM TEMPERATURE.   No facility-administered encounter medications on file as of 12/31/2023.    Social History: Social History   Tobacco Use   Smoking status: Never   Smokeless tobacco: Never  Vaping Use   Vaping status: Never Used  Substance Use Topics   Alcohol use: Not Currently    Alcohol/week: 0.0 - 1.0 standard drinks of alcohol    Comment: occasional   Drug use: No  Family Medical History: Family History  Problem Relation Age of Onset   Hypertension Mother    Diabetes Mother    Sleep apnea Mother    Hyperlipidemia Father    Sleep apnea Father    Colon cancer Father 11   Heart attack Father    Hypertension Father    Healthy Sister    Hypertension Brother    Cancer Maternal Grandfather        lung cancer in his 73s   Kidney disease Paternal Grandfather    Breast cancer Paternal Grandmother 50   Diabetes Paternal Aunt    Ovarian cancer Paternal Aunt 21   Lung cancer Other    Pancreatic  cancer Other    Cancer Maternal Uncle 77       tongue   Prostate cancer Paternal Uncle        104s   Prostate cancer Paternal Uncle        48s    Physical Examination: There were no vitals filed for this visit.  General: Patient is well developed, well nourished, calm, collected, and in no apparent distress. Attention to examination is appropriate.  Respiratory: Patient is breathing without any difficulty.   NEUROLOGICAL:     Awake, alert, oriented to person, place, and time.  Speech is clear and fluent. Fund of knowledge is appropriate.   Cranial Nerves: Pupils equal round and reactive to light.  Facial tone is symmetric.    *** ROM of cervical spine *** pain *** posterior cervical tenderness. *** tenderness in bilateral trapezial region.   *** ROM of lumbar spine *** pain *** posterior lumbar tenderness.   No abnormal lesions on exposed skin.   Strength: Side Biceps Triceps Deltoid Interossei Grip Wrist Ext. Wrist Flex.  R 5 5 5 5 5 5 5   L 5 5 5 5 5 5 5    Side Iliopsoas Quads Hamstring PF DF EHL  R 5 5 5 5 5 5   L 5 5 5 5 5 5    Reflexes are ***2+ and symmetric at the biceps, brachioradialis, patella and achilles.   Hoffman's is absent.  Clonus is not present.   Bilateral upper and lower extremity sensation is intact to light touch.     Gait is normal.   ***No difficulty with tandem gait.    Medical Decision Making  Imaging: Cervical xrays dated 04/08/22:  FINDINGS: Loss of the normal cervical lordosis is noted which may be related to muscular spasm. Seven cervical segments are well visualized. Osteophytic changes are noted from C4-C7. Mild neural foraminal narrowing is noted at these levels as well. The odontoid is within normal limits. No soft tissue abnormality is seen. No fracture or dislocation is seen.   IMPRESSION: Degenerative changes without acute abnormality.     Electronically Signed   By: Oneil Devonshire M.D.   On: 04/09/2022 23:41  I have  personally reviewed the images and agree with the above interpretation.  Assessment and Plan: Carolyn Hays is a pleasant 53 y.o. female has ***  Treatment options discussed with patient and following plan made:   - Order for physical therapy for *** spine ***. Patient to call to schedule appointment. *** - Continue current medications including ***. Reviewed dosing and side effects.  - Prescription for ***. Reviewed dosing and side effects. Take with food.  - Prescription for *** to take prn muscle spasms. Reviewed dosing and side effects. Discussed this can cause drowsiness.  - MRI of *** to further evaluate *** radiculopathy.  No improvement time or medications (***).  - Referral to PMR at [facility] to discuss possible *** injections.  - Will schedule phone visit to review MRI results once I get them back.   I spent a total of *** minutes in face-to-face and non-face-to-face activities related to this patient's care today including review of outside records, review of imaging, review of symptoms, physical exam, discussion of differential diagnosis, discussion of treatment options, and documentation.   Thank you for involving me in the care of this patient.   Glade Boys PA-C Dept. of Neurosurgery

## 2023-12-31 ENCOUNTER — Ambulatory Visit: Payer: 59 | Admitting: Orthopedic Surgery

## 2024-01-05 NOTE — Progress Notes (Deleted)
 Referring Physician:  Ronnald Ramp, MD [address] [place],  [place] [address]  Primary Physician:  Ronnald Ramp, MD  History of Present Illness: 01/05/2024*** [name] has a history of OSA, IBS, B12 neuropathy, carpal tunnel syndrome, RA, and high cholesterol.   Referred by rheumatology at [facility].   Neck pain that radiated down left arm  Did she have EMG?***   Also with LBP with no leg pain?  She has seen [facility] for her lower back?    She is taking cymbalta. No relief with motrin.    Duration: *** Location: *** Quality: *** Severity: ***  Precipitating: aggravated by *** Modifying factors: made better by *** Weakness: none Timing: *** Bowel/Bladder Dysfunction: none  Conservative measures:  Physical therapy: has not participated in PT  Multimodal medical therapy including regular antiinflammatories: flexeril, Cymbalta, neurontin Injections: no epidural steroid injections  Past Surgery: none  Chanie E Reddy has ***no symptoms of cervical myelopathy.  The symptoms are causing a significant impact on the patient's life.   Review of Systems:  A 10 point review of systems is negative, except for the pertinent positives and negatives detailed in the HPI.  Past Medical History: Past Medical History:  Diagnosis Date   Allergic rhinitis    Alopecia    Anxiety    Arthritis    Calculus of kidney    Carpal tunnel syndrome    Depression    Dyspareunia, female    Eczema    Family history of breast cancer    Neg BRCA/BART 2014; Neg MyRisk update 10/18; riskscore=19.2%/IBIS=19.1%   Family history of ovarian cancer    paternal aunt in her 90s   GERD (gastroesophageal reflux disease)    IBS (irritable bowel syndrome)    Vitamin B12 deficiency neuropathy (HCC)    Vitamin D deficiency     Past Surgical History: Past Surgical History:  Procedure Laterality Date   BARTHOLIN GLAND CYST EXCISION  2007   CESAREAN  SECTION  10/27/2010   CHOLECYSTECTOMY  10/2006   COLONOSCOPY  2012   one polyp ([facility])   Introital revision  10/26/2015   OTHER SURGICAL HISTORY  2020   BILATERAL VERICOSE VEIN TREATMENT VIA LASER.    Allergies: Allergies as of 01/13/2024 - Review Complete 11/30/2023  Allergen Reaction Noted   Indomethacin Other (See Comments) 09/11/2015    Medications: Outpatient Encounter Medications as of 01/13/2024  Medication Sig   cetirizine (ZYRTEC) 10 MG tablet Take 10 mg by mouth.   Cholecalciferol (VITAMIN D3) 2000 UNITS capsule Take by mouth.   DULoxetine (CYMBALTA) 30 MG capsule Take 30 mg by mouth daily. Taking 35 mg   gabapentin (NEURONTIN) 100 MG capsule Take 100 mg by mouth as needed.   Magnesium 100 MG CAPS Take 400 mg by mouth.   Omega-3 Fatty Acids (FISH OIL PO) Take 2,000 mg by mouth 2 (two) times daily.   omeprazole (PRILOSEC) 20 MG capsule Take 20 mg by mouth daily.   rosuvastatin (CRESTOR) 10 MG tablet Take 1 tablet (10 mg total) by mouth daily.   Tofacitinib Citrate 5 MG TABS Xeljanz 5 mg tablet  TAKE ONE TABLET BY MOUTH TWICE DAILY. MAY BE TAKEN WITH OR WITHOUT FOOD. STORE AT ROOM TEMPERATURE.   No facility-administered encounter medications on file as of 01/13/2024.    Social History: Social History   Tobacco Use   Smoking status: Never   Smokeless tobacco: Never  Vaping Use   Vaping status: Never Used  Substance  Use Topics   Alcohol use: Not Currently    Alcohol/week: 0.0 - 1.0 standard drinks of alcohol    Comment: occasional   Drug use: No    Family Medical History: Family History  Problem Relation Age of Onset   Hypertension Mother    Diabetes Mother    Sleep apnea Mother    Hyperlipidemia Father    Sleep apnea Father    Colon cancer Father 38   Heart attack Father    Hypertension Father    Healthy Sister    Hypertension Brother    Cancer Maternal Grandfather        lung cancer in his 69s   Kidney disease Paternal Grandfather     Breast cancer Paternal Grandmother 74   Diabetes Paternal Aunt    Ovarian cancer Paternal Aunt 32   Lung cancer Other    Pancreatic cancer Other    Cancer Maternal Uncle 53       tongue   Prostate cancer Paternal Uncle        43s   Prostate cancer Paternal Uncle        19s    Physical Examination: There were no vitals filed for this visit.  General: Patient is well developed, well nourished, calm, collected, and in no apparent distress. Attention to examination is appropriate.  Respiratory: Patient is breathing without any difficulty.   NEUROLOGICAL:     Awake, alert, oriented to person, place, and time.  Speech is clear and fluent. Fund of knowledge is appropriate.   Cranial Nerves: Pupils equal round and reactive to light.  Facial tone is symmetric.    *** ROM of cervical spine *** pain *** posterior cervical tenderness. *** tenderness in bilateral trapezial region.   *** ROM of lumbar spine *** pain *** posterior lumbar tenderness.   No abnormal lesions on exposed skin.   Strength: Side Biceps Triceps Deltoid Interossei Grip Wrist Ext. Wrist Flex.  R 5 5 5 5 5 5 5   L 5 5 5 5 5 5 5    Side Iliopsoas Quads Hamstring PF DF EHL  R 5 5 5 5 5 5   L 5 5 5 5 5 5    Reflexes are ***2+ and symmetric at the biceps, brachioradialis, patella and achilles.   Hoffman's is absent.  Clonus is not present.   Bilateral upper and lower extremity sensation is intact to light touch.     Gait is normal.   ***No difficulty with tandem gait.    Medical Decision Making  Imaging: Cervical xrays dated 04/08/22:  FINDINGS: Loss of the normal cervical lordosis is noted which may be related to muscular spasm. Seven cervical segments are well visualized. Osteophytic changes are noted from C4-C7. Mild neural foraminal narrowing is noted at these levels as well. The odontoid is within normal limits. No soft tissue abnormality is seen. No fracture or dislocation is seen.    IMPRESSION: Degenerative changes without acute abnormality.     Electronically Signed   By: Alcide Clever M.D.   On: 04/09/2022 23:41  I have personally reviewed the images and agree with the above interpretation.  No recent lumbar imaging.   Assessment and Plan: Ms. Coatney is a pleasant 53 y.o. female has ***  Treatment options discussed with patient and following plan made:   - Order for physical therapy for *** spine ***. Patient to call to schedule appointment. *** - Continue current medications including ***. Reviewed dosing and side effects.  - Prescription for ***.  Reviewed dosing and side effects. Take with food.  - Prescription for *** to take prn muscle spasms. Reviewed dosing and side effects. Discussed this can cause drowsiness.  - MRI of *** to further evaluate *** radiculopathy. No improvement time or medications (***).  - Referral to PMR at [facility] to discuss possible *** injections.  - Will schedule phone visit to review MRI results once I get them back.   I spent a total of *** minutes in face-to-face and non-face-to-face activities related to this patient's care today including review of outside records, review of imaging, review of symptoms, physical exam, discussion of differential diagnosis, discussion of treatment options, and documentation.   Thank you for involving me in the care of this patient.   Drake Leach PA-C Dept. of Neurosurgery

## 2024-01-11 ENCOUNTER — Ambulatory Visit: Payer: BC Managed Care – PPO | Admitting: Orthopedic Surgery

## 2024-01-11 NOTE — Progress Notes (Unsigned)
 Referring Physician:  Ronnald Ramp, MD [address] [place],  [place] [address]  Primary Physician:  Ronnald Ramp, MD  History of Present Illness: 01/12/2024 [name] has a history of OSA, IBS, B12 neuropathy, carpal tunnel syndrome, RA, and high cholesterol.   Referred by rheumatology at [facility].   She has intermittent neck pain that radiates down into left shoulder x 6 months. She is better after prednisone. She has more constant numbness and tingling in the arm to her fingers on left (index and thumb). Pain is worse with tilting her head to left side. Some relief with ice. She notes weakness in left arm.   She has known carpal tunnel bilaterally. Had EMG years ago.   She also has intermittent LBP with left anterior thigh pain x 2 months. No right leg pain. No numbness, tingling, or weakness. Some relief with ice. No specific aggravating factors.   She is taking cymbalta, neurontin prn.   She saw ortho urgent care about 2 weeks ago- was given prednisone and flexeril which helped a lot. She is out of flexeril.   She does not smoke.   Bowel/Bladder Dysfunction: none  Conservative measures: saw a chiropractor for her neck years ago Physical therapy: has not participated in PT  Multimodal medical therapy including regular antiinflammatories: flexeril, Cymbalta, neurontin Injections: no epidural steroid injections  Past Surgery: none  Coumba E Broers has no symptoms of cervical myelopathy.  The symptoms are causing a significant impact on the patient's life.   Review of Systems:  A 10 point review of systems is negative, except for the pertinent positives and negatives detailed in the HPI.  Past Medical History: Past Medical History:  Diagnosis Date   Allergic rhinitis    Alopecia    Anxiety    Arthritis    Calculus of kidney    Carpal tunnel syndrome    Depression    Dyspareunia, female    Eczema    Family history of  breast cancer    Neg BRCA/BART 2014; Neg MyRisk update 10/18; riskscore=19.2%/IBIS=19.1%   Family history of ovarian cancer    paternal aunt in her 9s   GERD (gastroesophageal reflux disease)    IBS (irritable bowel syndrome)    Vitamin B12 deficiency neuropathy (HCC)    Vitamin D deficiency     Past Surgical History: Past Surgical History:  Procedure Laterality Date   BARTHOLIN GLAND CYST EXCISION  2007   CESAREAN SECTION  10/27/2010   CHOLECYSTECTOMY  10/2006   COLONOSCOPY  2012   one polyp ([facility])   Introital revision  10/26/2015   OTHER SURGICAL HISTORY  2020   BILATERAL VERICOSE VEIN TREATMENT VIA LASER.    Allergies: Allergies as of 01/12/2024 - Review Complete 01/12/2024  Allergen Reaction Noted   Indomethacin Other (See Comments) 09/11/2015    Medications: Outpatient Encounter Medications as of 01/12/2024  Medication Sig   cetirizine (ZYRTEC) 10 MG tablet Take 10 mg by mouth.   Cholecalciferol (VITAMIN D3) 2000 UNITS capsule Take by mouth.   DULoxetine (CYMBALTA) 30 MG capsule Take 30 mg by mouth daily. Taking 35 mg   gabapentin (NEURONTIN) 100 MG capsule Take 100 mg by mouth as needed.   Magnesium 100 MG CAPS Take 400 mg by mouth.   Omega-3 Fatty Acids (FISH OIL PO) Take 2,000 mg by mouth 2 (two) times daily.   omeprazole (PRILOSEC) 20 MG capsule Take 20 mg by mouth daily.   rosuvastatin (CRESTOR) 10 MG tablet  Take 1 tablet (10 mg total) by mouth daily.   Tofacitinib Citrate 5 MG TABS Xeljanz 5 mg tablet  TAKE ONE TABLET BY MOUTH TWICE DAILY. MAY BE TAKEN WITH OR WITHOUT FOOD. STORE AT ROOM TEMPERATURE.   No facility-administered encounter medications on file as of 01/12/2024.    Social History: Social History   Tobacco Use   Smoking status: Never   Smokeless tobacco: Never  Vaping Use   Vaping status: Never Used  Substance Use Topics   Alcohol use: Not Currently    Alcohol/week: 0.0 - 1.0 standard drinks of alcohol    Comment: occasional    Drug use: No    Family Medical History: Family History  Problem Relation Age of Onset   Hypertension Mother    Diabetes Mother    Sleep apnea Mother    Hyperlipidemia Father    Sleep apnea Father    Colon cancer Father 22   Heart attack Father    Hypertension Father    Healthy Sister    Hypertension Brother    Cancer Maternal Grandfather        lung cancer in his 14s   Kidney disease Paternal Grandfather    Breast cancer Paternal Grandmother 17   Diabetes Paternal Aunt    Ovarian cancer Paternal Aunt 15   Lung cancer Other    Pancreatic cancer Other    Cancer Maternal Uncle 57       tongue   Prostate cancer Paternal Uncle        33s   Prostate cancer Paternal Uncle        10s    Physical Examination: Vitals:   01/12/24 1146  BP: 128/82    General: Patient is well developed, well nourished, calm, collected, and in no apparent distress. Attention to examination is appropriate.  Respiratory: Patient is breathing without any difficulty.   NEUROLOGICAL:     Awake, alert, oriented to person, place, and time.  Speech is clear and fluent. Fund of knowledge is appropriate.   Cranial Nerves: Pupils equal round and reactive to light.  Facial tone is symmetric.    No posterior cervical tenderness. Mild tenderness in left trapezial region.   No posterior lumbar tenderness.   No abnormal lesions on exposed skin.   Strength: Side Biceps Triceps Deltoid Interossei Grip Wrist Ext. Wrist Flex.  R 5 5 5 5 5 5 5   L 5 5 5 5 5 5 5    Side Iliopsoas Quads Hamstring PF DF EHL  R 5 5 5 5 5 5   L 5 5 5 5 5 5    Reflexes are 2+ and symmetric at the biceps, brachioradialis, patella and achilles.   Hoffman's is absent.  Clonus is not present.   Bilateral upper and lower extremity sensation is intact to light touch.     Good ROM of both shoulders with no pain.   No pain with IR/ER of both hips.   Gait is normal.    Medical Decision Making  Imaging: Cervical xrays dated  04/08/22:  FINDINGS: Loss of the normal cervical lordosis is noted which may be related to muscular spasm. Seven cervical segments are well visualized. Osteophytic changes are noted from C4-C7. Mild neural foraminal narrowing is noted at these levels as well. The odontoid is within normal limits. No soft tissue abnormality is seen. No fracture or dislocation is seen.   IMPRESSION: Degenerative changes without acute abnormality.     Electronically Signed   By: Eulah Pont.D.  On: 04/09/2022 23:41  I have personally reviewed the images and agree with the above interpretation.  No recent lumbar imaging.   Assessment and Plan: Ms. Souders has intermittent neck pain that radiates down into left shoulder x 6 months. She is better after prednisone. She has more constant numbness and tingling in the arm to her fingers on left (index and thumb). She notes weakness in left arm.   She has known cervical spondylosis and DDD. She also has known carpal tunnel bilaterally. Had EMG years ago.   She also has intermittent LBP with left anterior thigh pain x 2 months. No right leg pain. No numbness, tingling, or weakness.   No lumbar imaging available.   Treatment options discussed with patient and following plan made:   - MRI of cervical and lumbar spine ordered to evaluate radiculopathy. She still has symptoms despite time and  medications.  - Refill given on flexeril. Reviewed dosing and side effects. Discussed this can cause drowsiness.  - Will schedule follow up visit to review MRI results once I get them back.   I spent a total of 25 minutes in face-to-face and non-face-to-face activities related to this patient's care today including review of outside records, review of imaging, review of symptoms, physical exam, discussion of differential diagnosis, discussion of treatment options, and documentation.   Thank you for involving me in the care of this patient.   Drake Leach PA-C Dept. of  Neurosurgery

## 2024-01-12 ENCOUNTER — Ambulatory Visit (INDEPENDENT_AMBULATORY_CARE_PROVIDER_SITE_OTHER): Payer: 59 | Admitting: Orthopedic Surgery

## 2024-01-12 ENCOUNTER — Encounter: Payer: Self-pay | Admitting: Orthopedic Surgery

## 2024-01-12 VITALS — BP 128/82 | Ht 66.5 in | Wt 230.0 lb

## 2024-01-12 DIAGNOSIS — M4722 Other spondylosis with radiculopathy, cervical region: Secondary | ICD-10-CM

## 2024-01-12 DIAGNOSIS — M47812 Spondylosis without myelopathy or radiculopathy, cervical region: Secondary | ICD-10-CM

## 2024-01-12 DIAGNOSIS — G5603 Carpal tunnel syndrome, bilateral upper limbs: Secondary | ICD-10-CM | POA: Diagnosis not present

## 2024-01-12 DIAGNOSIS — M5416 Radiculopathy, lumbar region: Secondary | ICD-10-CM

## 2024-01-12 DIAGNOSIS — M5010 Cervical disc disorder with radiculopathy, unspecified cervical region: Secondary | ICD-10-CM | POA: Diagnosis not present

## 2024-01-12 DIAGNOSIS — M5412 Radiculopathy, cervical region: Secondary | ICD-10-CM

## 2024-01-12 DIAGNOSIS — M5442 Lumbago with sciatica, left side: Secondary | ICD-10-CM

## 2024-01-12 MED ORDER — CYCLOBENZAPRINE HCL 10 MG PO TABS: 10.0000 mg | ORAL_TABLET | Freq: Three times a day (TID) | ORAL | 0 refills | Status: DC | PRN

## 2024-01-12 NOTE — Patient Instructions (Signed)
 It was so nice to see you today. Thank you so much for coming in.    You have some wear and tear (arthritis) in your neck.   I want to get an MRI of your neck and lower back to look into things further. We will get this approved through your insurance and [facility] will call you to schedule the appointment.   [facility] (building with the white pillars) is located off of [address]. The address is [address] [place], [place] [address].    After you have the MRIs, it takes 14-21 days for me to get the results back. Once I have them, we will call you to schedule a follow up visit with me to review them.   I also sent a refill of cyclobenzaprine to help with muscle spasms. Use only as needed and be careful, this can make you sleepy.   Please do not hesitate to call if you have any questions or concerns. You can also message me in MyChart.   Drake Leach PA-C [phone]     The physicians and staff at [facility] Neurosurgery at [facility] are committed to providing excellent care. You may receive a survey asking for feedback about your experience at our office. We value you your feedback and appreciate you taking the time to to fill it out. The [facility] leadership team is also available to discuss your experience in person, feel free to contact us [phone].

## 2024-01-13 ENCOUNTER — Ambulatory Visit: Payer: 59 | Admitting: Orthopedic Surgery

## 2024-01-22 ENCOUNTER — Ambulatory Visit
Admission: RE | Admit: 2024-01-22 | Discharge: 2024-01-22 | Disposition: A | Discharge status: Home / Self Care | Payer: 59 | Source: Ambulatory Visit | Attending: Orthopedic Surgery | Admitting: Orthopedic Surgery

## 2024-01-22 DIAGNOSIS — M5416 Radiculopathy, lumbar region: Secondary | ICD-10-CM | POA: Diagnosis present

## 2024-01-22 DIAGNOSIS — M47812 Spondylosis without myelopathy or radiculopathy, cervical region: Secondary | ICD-10-CM | POA: Insufficient documentation

## 2024-01-22 DIAGNOSIS — M5412 Radiculopathy, cervical region: Secondary | ICD-10-CM | POA: Diagnosis present

## 2024-01-22 DIAGNOSIS — M5442 Lumbago with sciatica, left side: Secondary | ICD-10-CM

## 2024-02-11 NOTE — Telephone Encounter (Signed)
 Called reading room and requested cervical and Lumbar MRIs be read by radiologist.

## 2024-02-11 NOTE — Telephone Encounter (Signed)
 Please call reading room to get cervical and lumbar MRIs from 01/22/24 read.   Thanks!

## 2024-02-17 NOTE — Progress Notes (Unsigned)
 Referring Physician:  No referring provider defined for this encounter.  Primary Physician:  Ronnald Ramp, MD  History of Present Illness: 02/18/2024 Ms. Jaylenne Hamelin has a history of OSA, IBS, B12 neuropathy, carpal tunnel syndrome, RA, and high cholesterol.   Last seen by me on 01/12/24 for neck and LBP. She has known cervical spondylosis and DDD. She also has known carpal tunnel bilaterally. Had EMG years ago.   She is here to review her cervical and lumbar MRI scans. She was given flexeril refill at her last visit.   She continues with intermittent neck pain that radiates down into left shoulder. She has more constant numbness and tingling in the arm to her fingers on left (index and thumb). Pain is worse with tilting her head to left side. She notes weakness in left arm.   Neck pain and left arm numbness/tingling is her primary complaint.   She also has intermittent LBP with no current left leg pain. Her left anterior leg pain has resolved. No numbness, tingling, or weakness. Some relief with ice. No specific aggravating factors.   She is taking cymbalta, neurontin prn, flexeril prn.   She does not smoke.   Bowel/Bladder Dysfunction: none  Conservative measures: saw a chiropractor for her neck years ago Physical therapy: has not participated in PT  Multimodal medical therapy including regular antiinflammatories: flexeril, Cymbalta, neurontin Injections: no epidural steroid injections  Past Surgery: none  Mckell E Raju has no symptoms of cervical myelopathy.  The symptoms are causing a significant impact on the patient's life.   Review of Systems:  A 10 point review of systems is negative, except for the pertinent positives and negatives detailed in the HPI.  Past Medical History: Past Medical History:  Diagnosis Date   Allergic rhinitis    Alopecia    Anxiety    Arthritis    Calculus of kidney    Carpal tunnel syndrome    Depression     Dyspareunia, female    Eczema    Family history of breast cancer    Neg BRCA/BART 2014; Neg MyRisk update 10/18; riskscore=19.2%/IBIS=19.1%   Family history of ovarian cancer    paternal aunt in her 65s   GERD (gastroesophageal reflux disease)    IBS (irritable bowel syndrome)    Vitamin B12 deficiency neuropathy (HCC)    Vitamin D deficiency     Past Surgical History: Past Surgical History:  Procedure Laterality Date   BARTHOLIN GLAND CYST EXCISION  2007   CESAREAN SECTION  10/27/2010   CHOLECYSTECTOMY  10/2006   COLONOSCOPY  2012   one polyp ([facility])   Introital revision  10/26/2015   OTHER SURGICAL HISTORY  2020   BILATERAL VERICOSE VEIN TREATMENT VIA LASER.    Allergies: Allergies as of 02/18/2024 - Review Complete 02/18/2024  Allergen Reaction Noted   Indomethacin Other (See Comments) 09/11/2015    Medications: Outpatient Encounter Medications as of 02/18/2024  Medication Sig   cetirizine (ZYRTEC) 10 MG tablet Take 10 mg by mouth.   Cholecalciferol (VITAMIN D3) 2000 UNITS capsule Take by mouth.   cyclobenzaprine (FLEXERIL) 10 MG tablet Take 1 tablet (10 mg total) by mouth 3 (three) times daily as needed for muscle spasms. This can make you sleepy.   DULOXETINE HCL PO Take 35 mg by mouth daily. Taking 35 mg   gabapentin (NEURONTIN) 100 MG capsule Take 100 mg by mouth as needed.   Magnesium 100 MG CAPS Take 400 mg by mouth.   Omega-3  Fatty Acids (FISH OIL) 1000 MG CPDR Take 4,000 mg by mouth daily.   omeprazole (PRILOSEC) 20 MG capsule Take 20 mg by mouth daily.   rosuvastatin (CRESTOR) 10 MG tablet Take 1 tablet (10 mg total) by mouth daily.   Tofacitinib Citrate 5 MG TABS Xeljanz 5 mg tablet  TAKE ONE TABLET BY MOUTH TWICE DAILY. MAY BE TAKEN WITH OR WITHOUT FOOD. STORE AT ROOM TEMPERATURE.   Turmeric (QC TUMERIC COMPLEX PO) Take by mouth.   No facility-administered encounter medications on file as of 02/18/2024.    Social History: Social History    Tobacco Use   Smoking status: Never   Smokeless tobacco: Never  Vaping Use   Vaping status: Never Used  Substance Use Topics   Alcohol use: Not Currently    Alcohol/week: 0.0 - 1.0 standard drinks of alcohol    Comment: occasional   Drug use: No    Family Medical History: Family History  Problem Relation Age of Onset   Hypertension Mother    Diabetes Mother    Sleep apnea Mother    Hyperlipidemia Father    Sleep apnea Father    Colon cancer Father 24   Heart attack Father    Hypertension Father    Healthy Sister    Hypertension Brother    Cancer Maternal Grandfather        lung cancer in his 71s   Kidney disease Paternal Grandfather    Breast cancer Paternal Grandmother 31   Diabetes Paternal Aunt    Ovarian cancer Paternal Aunt 78   Lung cancer Other    Pancreatic cancer Other    Cancer Maternal Uncle 31       tongue   Prostate cancer Paternal Uncle        26s   Prostate cancer Paternal Uncle        55s    Physical Examination: Vitals:   02/18/24 1038  BP: 128/80      Awake, alert, oriented to person, place, and time.  Speech is clear and fluent. Fund of knowledge is appropriate.   Cranial Nerves: Pupils equal round and reactive to light.  Facial tone is symmetric.    No abnormal lesions on exposed skin.   Strength: Side Biceps Triceps Deltoid Interossei Grip Wrist Ext. Wrist Flex.  R 5 5 5 5 5 5 5   L 5 5 5 5 5 5 5    Side Iliopsoas Quads Hamstring PF DF EHL  R 5 5 5 5 5 5   L 5 5 5 5 5 5    Reflexes are 2+ and symmetric at the biceps, brachioradialis, patella and achilles.   Hoffman's is absent.  Clonus is not present.   Bilateral upper and lower extremity sensation is intact to light touch.     Good ROM of both shoulders with no pain.   Gait is normal.    Medical Decision Making  Imaging: MRI of cervical spine dated 01/22/24:  FINDINGS: Alignment: Mild reversal of the normal cervical lordosis and mild degenerative posterior subluxation  C4, C5 and C6.   Vertebrae: Normal marrow signal. No bone lesions or fractures. The facets are normally aligned.   Cord: Moderate mass effect on the cervical spinal cord at C4-5 cord edema or ischemic changes.   Posterior Fossa, vertebral arteries, paraspinal tissues: No significant findings.   Disc levels:   C2-3: No significant findings.   C3-4: Mild bulging uncovered disc with slight flattening of the ventral thecal sac but no significant spinal  stenosis. Shallow disc osteophyte complexes bilaterally with mild bilateral foraminal narrowing.   C4-5: Large central disc protrusion with significant mass effect on the ventral thecal sac and ventral cervical spinal cord. Right-sided disc osteophyte complex is also present with significant right foraminal stenosis.   C5-6: Degenerative disc disease with bulging degenerated annulus and osteophytic ridging. There is flattening of the ventral thecal sac and narrowing of the ventral CSF space with mild spinal stenosis. Bilateral disc osteophyte complexes with moderate bilateral foraminal stenosis.   C6-7: Bulging degenerated and slightly uncovered disc with flattening of the ventral thecal sac and narrowing the ventral CSF space. Bilateral disc osteophyte complexes with mild/moderate foraminal stenosis bilaterally.   C7-T1: No significant findings.   IMPRESSION: 1. Large central disc protrusion at C4-5 with significant mass effect on the ventral thecal sac and ventral cervical spinal cord. No cord edema or ischemic changes. 2. Significant right foraminal stenosis at C4-5. 3. Mild spinal stenosis and moderate bilateral foraminal stenosis at C5-6. 4. Mild/moderate foraminal stenosis bilaterally at C6-7.     Electronically Signed   By: Rudie Meyer M.D.   On: 02/11/2024 12:23   MRI of lumbar spine dated 01/22/24:  FINDINGS: Segmentation: There are five lumbar type vertebral bodies. The last full intervertebral disc space  is labeled L5-S1.   Alignment:  Normal   Vertebrae:  Normal marrow signal.  No bone lesions or fractures.   Conus medullaris and cauda equina: Conus extends to the L1 level. Conus and cauda equina appear normal.   Paraspinal and other soft tissues: No significant paraspinal or retroperitoneal findings.   Disc levels:   L1-2: No significant findings.   L2-3: There is a small focal left foraminal disc protrusion contacting and displacing the left L2 nerve root and likely causing the patient's left lower extremity radiculopathy. No spinal or right foraminal stenosis.   L3-4: No significant findings.   L4-5: No significant findings.   L5-S1: Mild facet disease but no disc protrusions, spinal or foraminal stenosis.   IMPRESSION: 1. Small focal left foraminal disc protrusion at L2-3 contacting and displacing the left L2 nerve root and likely causing the patient's left lower extremity radiculopathy. 2. No other significant findings.     Electronically Signed   By: Rudie Meyer M.D.   On: 02/11/2024 12:08  I have personally reviewed the images and agree with the above interpretation.  Assessment and Plan: Ms. Bovey  continues with intermittent neck pain that radiates down into left shoulder. She has more constant numbness and tingling in the arm to her fingers on left (index and thumb). No dexterity or balance issues.   Neck pain and left arm numbness/tingling is her primary complaint.   She has known cervical spondylosis with multilevel foraminal stenosis, mild central stenosis C4-C6.   Neck pain likely due to underlying spondylosis. Left arm pain may be due to foraminal stenosis. Had disc at C4-C5 but it is right sided.   She also has intermittent LBP with no current left leg pain. Her left anterior leg pain has resolved. No numbness, tingling, or weakness.   She has known lumbar spondylosis with left foraminal disc at L2-L3.   Treatment options discussed with  patient and following plan made:   - PT for cervical spine. Orders to [facility].  - Referral to PMR at [facility] to discuss possible cervical injections.  - Consider EMG of upper extremities if numbness/tingling in left arm persists after PT/injections.  - No signs/symptoms of cervical myelopathy. Discussed  at length. Will call if these occur.  - Hold on further treatment for her lower back as pain is only intermittent and tolerable.  - Follow up with me in 6-8 weeks and prn.   I spent a total of 30 minutes in face-to-face and non-face-to-face activities related to this patient's care today including review of outside records, review of imaging, review of symptoms, physical exam, discussion of differential diagnosis, discussion of treatment options, and documentation.   Drake Leach PA-C Dept. of Neurosurgery

## 2024-02-18 ENCOUNTER — Encounter: Payer: Self-pay | Admitting: Orthopedic Surgery

## 2024-02-18 ENCOUNTER — Ambulatory Visit (INDEPENDENT_AMBULATORY_CARE_PROVIDER_SITE_OTHER): Admitting: Orthopedic Surgery

## 2024-02-18 VITALS — BP 128/80 | Ht 66.5 in | Wt 230.0 lb

## 2024-02-18 DIAGNOSIS — M47816 Spondylosis without myelopathy or radiculopathy, lumbar region: Secondary | ICD-10-CM | POA: Diagnosis not present

## 2024-02-18 DIAGNOSIS — M5126 Other intervertebral disc displacement, lumbar region: Secondary | ICD-10-CM

## 2024-02-18 DIAGNOSIS — M50221 Other cervical disc displacement at C4-C5 level: Secondary | ICD-10-CM | POA: Diagnosis not present

## 2024-02-18 DIAGNOSIS — M4722 Other spondylosis with radiculopathy, cervical region: Secondary | ICD-10-CM

## 2024-02-18 DIAGNOSIS — M47812 Spondylosis without myelopathy or radiculopathy, cervical region: Secondary | ICD-10-CM

## 2024-02-18 DIAGNOSIS — M5020 Other cervical disc displacement, unspecified cervical region: Secondary | ICD-10-CM

## 2024-02-18 DIAGNOSIS — M5412 Radiculopathy, cervical region: Secondary | ICD-10-CM

## 2024-02-18 NOTE — Patient Instructions (Signed)
 It was so nice to see you today. Thank you so much for coming in.    You have some wear and tear in your neck. This is likely causing your neck and left arm pain.   You have some wear and tear in your lower back as well. Have a disc at L2-L3 that was likely causing your left leg pain.   I sent physical therapy orders to [facility]. You can call them at [phone], if you don't hear from them to schedule your visit. They are located at [employer] in [place].   I want you to see physical medicine and rehab at the [facility] to discuss possible injections in your neck. Dr. Yves Dill, Dr. Mariah Milling, and their PA Alphonzo Lemmings are great and will take good care of you. They should call you to schedule an appointment or you can call them at [phone].   I will see you back in 6-8 weeks. Please do not hesitate to call if you have any questions or concerns. You can also message me in MyChart.   Drake Leach PA-C [phone]     The physicians and staff at [facility] Neurosurgery at [facility] are committed to providing excellent care. You may receive a survey asking for feedback about your experience at our office. We value you your feedback and appreciate you taking the time to to fill it out. The [facility] leadership team is also available to discuss your experience in person, feel free to contact us [phone].

## 2024-02-29 ENCOUNTER — Telehealth: Payer: Self-pay

## 2024-02-29 NOTE — Telephone Encounter (Signed)
 Reviewed

## 2024-02-29 NOTE — Telephone Encounter (Signed)
 Copied from CRM [id number]. Topic: General - Other >> Feb 29, 2024 10:37 AM Alcus Dad wrote: Reason for EAV:[id number] from Monia Pouch stated that she will be working with patient. Please call [phone]

## 2024-03-01 ENCOUNTER — Ambulatory Visit: Payer: Self-pay | Admitting: Family Medicine

## 2024-04-01 NOTE — Progress Notes (Deleted)
 Referring Physician:  Mimi Alt, MD [address] [place],  [place] [address]  Primary Physician:  Mimi Alt, MD  History of Present Illness: 04/01/2024 [name] has a history of OSA, IBS, B12 neuropathy, carpal tunnel syndrome, RA, and high cholesterol.   Last seen by me on 02/18/24 for neck and LBP. She has known cervical spondylosis with multilevel foraminal stenosis, mild central stenosis C4-C6. She also has known lumbar spondylosis with left foraminal disc at L2-L3.   LBP was tolerable so she wanted to hold on treatment. She was sent to PT for her cervical spine and referred to PMR for possible injections.   Started [facility] and they were going to try some dry needling. She saw Dr. Erman Hayward and had left C4-C5 TF ESI on 03/03/24.   She is here for follow up.   She feels like both the PT and ESI have helped with her pain.        She is here to review her cervical and lumbar MRI scans. She was given flexeril  refill at her last visit.   She continues with intermittent neck pain that radiates down into left shoulder. She has more constant numbness and tingling in the arm to her fingers on left (index and thumb). Pain is worse with tilting her head to left side. She notes weakness in left arm.   Neck pain and left arm numbness/tingling is her primary complaint.   She also has intermittent LBP with no current left leg pain. Her left anterior leg pain has resolved. No numbness, tingling, or weakness. Some relief with ice. No specific aggravating factors.   She is taking cymbalta, neurontin prn, flexeril  prn.   She does not smoke.   Bowel/Bladder Dysfunction: none  Conservative measures: saw a chiropractor for her neck years ago Physical therapy: initial evaluation at [facility] for cervical spine on 02/23/24 Multimodal medical therapy including regular antiinflammatories: flexeril , Cymbalta, neurontin Injections:   03/03/2024: Left C4-5 transforaminal ESI   Past Surgery: none  Carolyn Hays has no symptoms of cervical myelopathy.  The symptoms are causing a significant impact on the patient's life.   Review of Systems:  A 10 point review of systems is negative, except for the pertinent positives and negatives detailed in the HPI.  Past Medical History: Past Medical History:  Diagnosis Date   Allergic rhinitis    Alopecia    Anxiety    Arthritis    Calculus of kidney    Carpal tunnel syndrome    Depression    Dyspareunia, female    Eczema    Family history of breast cancer    Neg BRCA/BART 2014; Neg MyRisk update 10/18; riskscore=19.2%/IBIS=19.1%   Family history of ovarian cancer    paternal aunt in her 52s   GERD (gastroesophageal reflux disease)    IBS (irritable bowel syndrome)    Vitamin B12 deficiency neuropathy (HCC)    Vitamin D  deficiency     Past Surgical History: Past Surgical History:  Procedure Laterality Date   BARTHOLIN GLAND CYST EXCISION  2007   CESAREAN SECTION  10/27/2010   CHOLECYSTECTOMY  10/2006   COLONOSCOPY  2012   one polyp ([facility])   Introital revision  10/26/2015   OTHER SURGICAL HISTORY  2020   BILATERAL VERICOSE VEIN TREATMENT VIA LASER.    Allergies: Allergies as of 04/07/2024 - Review Complete 02/18/2024  Allergen Reaction Noted   Indomethacin Other (See Comments) 09/11/2015    Medications:  Outpatient Encounter Medications as of 04/07/2024  Medication Sig   cetirizine (ZYRTEC) 10 MG tablet Take 10 mg by mouth.   Cholecalciferol (VITAMIN D3) 2000 UNITS capsule Take by mouth.   cyclobenzaprine  (FLEXERIL ) 10 MG tablet Take 1 tablet (10 mg total) by mouth 3 (three) times daily as needed for muscle spasms. This can make you sleepy.   DULOXETINE HCL PO Take 35 mg by mouth daily. Taking 35 mg   gabapentin (NEURONTIN) 100 MG capsule Take 100 mg by mouth as needed.   Magnesium 100 MG CAPS Take 400 mg by mouth.   Omega-3 Fatty Acids  (FISH OIL) 1000 MG CPDR Take 4,000 mg by mouth daily.   omeprazole (PRILOSEC) 20 MG capsule Take 20 mg by mouth daily.   rosuvastatin  (CRESTOR ) 10 MG tablet Take 1 tablet (10 mg total) by mouth daily.   Tofacitinib Citrate 5 MG TABS Xeljanz 5 mg tablet  TAKE ONE TABLET BY MOUTH TWICE DAILY. MAY BE TAKEN WITH OR WITHOUT FOOD. STORE AT ROOM TEMPERATURE.   Turmeric (QC TUMERIC COMPLEX PO) Take by mouth.   No facility-administered encounter medications on file as of 04/07/2024.    Social History: Social History   Tobacco Use   Smoking status: Never   Smokeless tobacco: Never  Vaping Use   Vaping status: Never Used  Substance Use Topics   Alcohol use: Not Currently    Alcohol/week: 0.0 - 1.0 standard drinks of alcohol    Comment: occasional   Drug use: No    Family Medical History: Family History  Problem Relation Age of Onset   Hypertension Mother    Diabetes Mother    Sleep apnea Mother    Hyperlipidemia Father    Sleep apnea Father    Colon cancer Father 27   Heart attack Father    Hypertension Father    Healthy Sister    Hypertension Brother    Cancer Maternal Grandfather        lung cancer in his 73s   Kidney disease Paternal Grandfather    Breast cancer Paternal Grandmother 39   Diabetes Paternal Aunt    Ovarian cancer Paternal Aunt 71   Lung cancer Other    Pancreatic cancer Other    Cancer Maternal Uncle 42       tongue   Prostate cancer Paternal Uncle        62s   Prostate cancer Paternal Uncle        12s    Physical Examination: There were no vitals filed for this visit.  Awake, alert, oriented to person, place, and time.  Speech is clear and fluent. Fund of knowledge is appropriate.   Cranial Nerves: Pupils equal round and reactive to light.  Facial tone is symmetric.    No abnormal lesions on exposed skin.   Strength: Side Biceps Triceps Deltoid Interossei Grip Wrist Ext. Wrist Flex.  R 5 5 5 5 5 5 5   L 5 5 5 5 5 5 5    Side Iliopsoas Quads  Hamstring PF DF EHL  R 5 5 5 5 5 5   L 5 5 5 5 5 5    Reflexes are 2+ and symmetric at the biceps, brachioradialis, patella and achilles.   Hoffman's is absent.  Clonus is not present.   Bilateral upper and lower extremity sensation is intact to light touch.     Good ROM of both shoulders with no pain.   Gait is normal.    Medical Decision Making  Imaging:  none   Assessment and Plan: Ms. Maceachern  continues with intermittent neck pain that radiates down into left shoulder. She has more constant numbness and tingling in the arm to her fingers on left (index and thumb). No dexterity or balance issues.   Neck pain and left arm numbness/tingling is her primary complaint.   She has known cervical spondylosis with multilevel foraminal stenosis, mild central stenosis C4-C6.   Neck pain likely due to underlying spondylosis. Left arm pain may be due to foraminal stenosis. Had disc at C4-C5 but it is right sided.   She also has intermittent LBP with no current left leg pain. Her left anterior leg pain has resolved. No numbness, tingling, or weakness.   She has known lumbar spondylosis with left foraminal disc at L2-L3.   Treatment options discussed with patient and following plan made:   - PT for cervical spine. Orders to [facility].  - Referral to [facility] at [facility] to discuss possible cervical injections.  - Consider EMG of upper extremities if numbness/tingling in left arm persists after PT/injections.  - No signs/symptoms of cervical myelopathy. Discussed at length. Will call if these occur.  - Hold on further treatment for her lower back as pain is only intermittent and tolerable.  - Follow up with me in 6-8 weeks and prn.   I spent a total of 30 minutes in face-to-face and non-face-to-face activities related to this patient's care today including review of outside records, review of imaging, review of symptoms, physical exam, discussion of differential diagnosis, discussion of treatment options,  and documentation.   Carolyn Russel PA-C Dept. of Neurosurgery

## 2024-04-06 ENCOUNTER — Encounter: Payer: Self-pay | Admitting: Orthopedic Surgery

## 2024-04-06 ENCOUNTER — Ambulatory Visit (INDEPENDENT_AMBULATORY_CARE_PROVIDER_SITE_OTHER): Admitting: Orthopedic Surgery

## 2024-04-06 VITALS — BP 136/88 | Ht 66.5 in | Wt 230.0 lb

## 2024-04-06 DIAGNOSIS — M50221 Other cervical disc displacement at C4-C5 level: Secondary | ICD-10-CM

## 2024-04-06 DIAGNOSIS — M47812 Spondylosis without myelopathy or radiculopathy, cervical region: Secondary | ICD-10-CM

## 2024-04-06 DIAGNOSIS — M4722 Other spondylosis with radiculopathy, cervical region: Secondary | ICD-10-CM

## 2024-04-06 DIAGNOSIS — M5412 Radiculopathy, cervical region: Secondary | ICD-10-CM

## 2024-04-06 DIAGNOSIS — M4802 Spinal stenosis, cervical region: Secondary | ICD-10-CM | POA: Diagnosis not present

## 2024-04-06 DIAGNOSIS — M5020 Other cervical disc displacement, unspecified cervical region: Secondary | ICD-10-CM

## 2024-04-06 NOTE — Progress Notes (Signed)
 Referring Physician:  Mimi Alt, MD [address] [place],  [place] [address]  Primary Physician:  Mimi Alt, MD  History of Present Illness: [name] has a history of OSA, IBS, B12 neuropathy, carpal tunnel syndrome, RA, and high cholesterol.   Last seen by me on 02/18/24 for neck and LBP. She has known cervical spondylosis with multilevel foraminal stenosis, mild central stenosis C4-C6. She also has known lumbar spondylosis with left foraminal disc at L2-L3.   LBP was tolerable so she wanted to hold on treatment. She was sent to PT for her cervical spine and referred to PMR for possible injections.   Started PT at [facility] on 02/23/24 and they were going to try some dry needling. She saw Dr. Erman Hayward and had left C4-C5 TF ESI on 03/03/24.   She is here for follow up.   She feels like PT has helped the most with her pain. Did not see much relief with cervical ESI.   She continues with more constant neck pain with tingling in left arm to her hand. No arm pain. Pain is generally mild but can get up to a 7-8. Pain is worse with tilting her head to left side. No weakness in left arm.   She is feeling better, but still has bad days.   She is taking cymbalta, neurontin prn, flexeril  prn.   She does not smoke.    Conservative measures: saw a chiropractor for her neck years ago Physical therapy: initial evaluation at [facility] for cervical spine on 02/23/24 and she is still going Multimodal medical therapy including regular antiinflammatories: flexeril , Cymbalta, neurontin Injections:  03/03/2024: Left C4-5 transforaminal ESI   Past Surgery: none  Alba E Mallinger has no symptoms of cervical myelopathy.  The symptoms are causing a significant impact on the patient's life.   Review of Systems:  A 10 point review of systems is negative, except for the pertinent positives and negatives detailed in the HPI.  Past Medical History: Past  Medical History:  Diagnosis Date   Allergic rhinitis    Alopecia    Anxiety    Arthritis    Calculus of kidney    Carpal tunnel syndrome    Depression    Dyspareunia, female    Eczema    Family history of breast cancer    Neg BRCA/BART 2014; Neg MyRisk update 10/18; riskscore=19.2%/IBIS=19.1%   Family history of ovarian cancer    paternal aunt in her 67s   GERD (gastroesophageal reflux disease)    IBS (irritable bowel syndrome)    Vitamin B12 deficiency neuropathy (HCC)    Vitamin D  deficiency     Past Surgical History: Past Surgical History:  Procedure Laterality Date   BARTHOLIN GLAND CYST EXCISION  2007   CESAREAN SECTION  10/27/2010   CHOLECYSTECTOMY  10/2006   COLONOSCOPY  2012   one polyp ([facility])   Introital revision  10/26/2015   OTHER SURGICAL HISTORY  2020   BILATERAL VERICOSE VEIN TREATMENT VIA LASER.    Allergies: Allergies as of 04/07/2024 - Review Complete 02/18/2024  Allergen Reaction Noted   Indomethacin Other (See Comments) 09/11/2015    Medications: Outpatient Encounter Medications as of 04/07/2024  Medication Sig   cetirizine (ZYRTEC) 10 MG tablet Take 10 mg by mouth.   Cholecalciferol (VITAMIN D3) 2000 UNITS capsule Take by mouth.   cyclobenzaprine  (FLEXERIL ) 10 MG tablet Take 1 tablet (10 mg total) by mouth 3 (three) times daily as needed for muscle spasms.  This can make you sleepy.   DULOXETINE HCL PO Take 35 mg by mouth daily. Taking 35 mg   gabapentin (NEURONTIN) 100 MG capsule Take 100 mg by mouth as needed.   Magnesium 100 MG CAPS Take 400 mg by mouth.   Omega-3 Fatty Acids (FISH OIL) 1000 MG CPDR Take 4,000 mg by mouth daily.   omeprazole (PRILOSEC) 20 MG capsule Take 20 mg by mouth daily.   rosuvastatin  (CRESTOR ) 10 MG tablet Take 1 tablet (10 mg total) by mouth daily.   Tofacitinib Citrate 5 MG TABS Xeljanz 5 mg tablet  TAKE ONE TABLET BY MOUTH TWICE DAILY. MAY BE TAKEN WITH OR WITHOUT FOOD. STORE AT ROOM TEMPERATURE.    Turmeric (QC TUMERIC COMPLEX PO) Take by mouth.   No facility-administered encounter medications on file as of 04/07/2024.    Social History: Social History   Tobacco Use   Smoking status: Never   Smokeless tobacco: Never  Vaping Use   Vaping status: Never Used  Substance Use Topics   Alcohol use: Not Currently    Alcohol/week: 0.0 - 1.0 standard drinks of alcohol    Comment: occasional   Drug use: No    Family Medical History: Family History  Problem Relation Age of Onset   Hypertension Mother    Diabetes Mother    Sleep apnea Mother    Hyperlipidemia Father    Sleep apnea Father    Colon cancer Father 34   Heart attack Father    Hypertension Father    Healthy Sister    Hypertension Brother    Cancer Maternal Grandfather        lung cancer in his 44s   Kidney disease Paternal Grandfather    Breast cancer Paternal Grandmother 42   Diabetes Paternal Aunt    Ovarian cancer Paternal Aunt 64   Lung cancer Other    Pancreatic cancer Other    Cancer Maternal Uncle 47       tongue   Prostate cancer Paternal Uncle        33s   Prostate cancer Paternal Uncle        68s    Physical Examination: BP 136/88  Awake, alert, oriented to person, place, and time.  Speech is clear and fluent. Fund of knowledge is appropriate.   Cranial Nerves: Pupils equal round and reactive to light.  Facial tone is symmetric.    No abnormal lesions on exposed skin.   Strength: Side Biceps Triceps Deltoid Interossei Grip Wrist Ext. Wrist Flex.  R 5 5 5 5 5 5 5   L 5 5 5 5 5 5 5    Side Iliopsoas Quads Hamstring PF DF EHL  R 5 5 5 5 5 5   L 5 5 5 5 5 5    Reflexes are 2+ and symmetric at the biceps, brachioradialis, patella and achilles.   Hoffman's is absent.  Clonus is not present.   Bilateral upper and lower extremity sensation is intact to light touch.     Gait is normal.    Medical Decision Making  Imaging: none   Assessment and Plan: Ms. Maddy  continues with more  constant neck pain with tingling in left arm to her hand. No arm pain. Pain is generally mild but can get up to a 7-8. Pain is worse with tilting her head to left side. No weakness in left arm.   She has known cervical spondylosis with multilevel foraminal stenosis, mild central stenosis C4-C6.   Neck pain likely  due to underlying spondylosis. Left arm pain may be due to foraminal stenosis. Had disc at C4-C5 but it is right sided. She was also diagnosed with carpal tunnel years ago.   Treatment options discussed with patient and following plan made:   - She is improving slowly with PT and would like to continue.  - Hold on further cervical ESI for now.  - No signs/symptoms of cervical myelopathy. Discussed at length.  - Follow up with me in 6-8 weeks and prn.   I spent a total of  20 minutes in face-to-face and non-face-to-face activities related to this patient's care today including review of outside records, review of imaging, review of symptoms, physical exam, discussion of differential diagnosis, discussion of treatment options, and documentation.   Lucetta Russel PA-C Dept. of Neurosurgery

## 2024-04-07 ENCOUNTER — Ambulatory Visit: Admitting: Orthopedic Surgery

## 2024-04-11 ENCOUNTER — Other Ambulatory Visit: Payer: Self-pay | Admitting: Family Medicine

## 2024-04-11 ENCOUNTER — Encounter: Payer: Self-pay | Admitting: Family Medicine

## 2024-04-11 ENCOUNTER — Ambulatory Visit (INDEPENDENT_AMBULATORY_CARE_PROVIDER_SITE_OTHER): Admitting: Family Medicine

## 2024-04-11 VITALS — BP 125/79 | HR 96 | Ht 66.0 in | Wt 230.4 lb

## 2024-04-11 DIAGNOSIS — R030 Elevated blood-pressure reading, without diagnosis of hypertension: Secondary | ICD-10-CM

## 2024-04-11 DIAGNOSIS — L680 Hirsutism: Secondary | ICD-10-CM | POA: Diagnosis not present

## 2024-04-11 DIAGNOSIS — Z1231 Encounter for screening mammogram for malignant neoplasm of breast: Secondary | ICD-10-CM

## 2024-04-11 DIAGNOSIS — M0609 Rheumatoid arthritis without rheumatoid factor, multiple sites: Secondary | ICD-10-CM

## 2024-04-11 DIAGNOSIS — E7800 Pure hypercholesterolemia, unspecified: Secondary | ICD-10-CM

## 2024-04-11 DIAGNOSIS — M4690 Unspecified inflammatory spondylopathy, site unspecified: Secondary | ICD-10-CM

## 2024-04-11 NOTE — Progress Notes (Signed)
 Established patient visit   Patient: Carolyn Hays   DOB: 15-Aug-1971   53 y.o. Female  MRN: [id number] Visit Date: 04/11/2024  Today's healthcare provider: Mimi Alt, MD   Chief Complaint  Patient presents with   Medical Management of Chronic Issues    Patient reports checking blood pressure at home. She reports taking medications as prescribed with no symptoms to report. She does not smoke.   Hyperlipidemia   Hypertension   Facial Hair    Facial hair X 5 years and wanting to see if anything can be done about it or if she would need to go to endocrinology.    Subjective     HPI     Medical Management of Chronic Issues    Additional comments: Patient reports checking blood pressure at home. She reports taking medications as prescribed with no symptoms to report. She does not smoke.        Facial Hair    Additional comments: Facial hair X 5 years and wanting to see if anything can be done about it or if she would need to go to endocrinology.       Last edited by Pasty Bongo, CMA on 04/11/2024  9:53 AM.       Discussed the use of AI scribe software for clinical note transcription with the patient, who gave verbal consent to proceed.  History of Present Illness Carolyn Hays is a 53 year old female who presents for follow-up on hypertension and cholesterol management.  She continues to take Crestor  10 mg daily for hyperlipidemia. Previous cholesterol levels from January 2025 showed a total cholesterol of 260 mg/dL and LDL of 782 mg/dL, with an ASCVD risk score of 1.8%. Her last A1c was 5.7%, and her metabolic panel was normal.  She experiences hirsutism, using Rogaine topically since November 2024, primarily on the top of her head. She has been undergoing electrolysis for nearly five years, with some areas clearing up but others worsening. She has hair thinning on the top of her head and growth of facial hair, including black hairs on her cheeks.  She has a history of folliculitis and ingrown hairs, particularly after shaving. She was previously referred to endocrinology but was not seen. Her gynecologist tested her testosterone  levels two years ago, which were reported as normal.  She is currently undergoing physical therapy for bulging discs in her neck and has had a steroid injection, which did not provide relief. She finds dry needling to be beneficial. She walks about four days a week for 30 minutes to an hour, which she finds helpful.  She has a history of scalp issues, with hair loss and scalp breakouts occurring after the birth of her son 13 years ago. Her scalp condition improves when she avoids non-organic wheat products. She also experiences hair breakage where her CPAP mask rests, similar to her sister's experience.     Past Medical History:  Diagnosis Date   Allergic rhinitis    Alopecia    Anxiety    Arthritis    Calculus of kidney    Carpal tunnel syndrome    Depression    Dyspareunia, female    Eczema    Family history of breast cancer    Neg BRCA/BART 2014; Neg MyRisk update 10/18; riskscore=19.2%/IBIS=19.1%   Family history of ovarian cancer    paternal aunt in her 45s   GERD (gastroesophageal reflux disease)    IBS (irritable bowel syndrome)  Vitamin B12 deficiency neuropathy (HCC)    Vitamin D  deficiency     Medications: Outpatient Medications Prior to Visit  Medication Sig   cetirizine (ZYRTEC) 10 MG tablet Take 10 mg by mouth.   Cholecalciferol (VITAMIN D3) 2000 UNITS capsule Take by mouth.   cyclobenzaprine  (FLEXERIL ) 10 MG tablet Take 1 tablet (10 mg total) by mouth 3 (three) times daily as needed for muscle spasms. This can make you sleepy.   DULOXETINE HCL PO Take 35 mg by mouth daily. Taking 35 mg   gabapentin (NEURONTIN) 100 MG capsule Take 100 mg by mouth as needed.   Magnesium 100 MG CAPS Take 400 mg by mouth.   minoxidil (ROGAINE) 2 % external solution Apply topically.   Omega-3 Fatty  Acids (FISH OIL) 1000 MG CPDR Take 4,000 mg by mouth daily.   omeprazole (PRILOSEC) 20 MG capsule Take 20 mg by mouth daily.   rosuvastatin  (CRESTOR ) 10 MG tablet Take 1 tablet (10 mg total) by mouth daily.   Tofacitinib Citrate 5 MG TABS Xeljanz 5 mg tablet  TAKE ONE TABLET BY MOUTH TWICE DAILY. MAY BE TAKEN WITH OR WITHOUT FOOD. STORE AT ROOM TEMPERATURE.   Turmeric (QC TUMERIC COMPLEX PO) Take by mouth.   No facility-administered medications prior to visit.    Review of Systems  Last metabolic panel Lab Results  Component Value Date   GLUCOSE 99 11/30/2023   NA 138 11/30/2023   K 4.6 11/30/2023   CL 101 11/30/2023   CO2 22 11/30/2023   BUN 17 11/30/2023   CREATININE 0.83 11/30/2023   EGFR 84 11/30/2023   CALCIUM  9.9 11/30/2023   PROT 7.8 11/30/2023   ALBUMIN 4.5 11/30/2023   LABGLOB 3.3 11/30/2023   AGRATIO 1.5 10/13/2022   BILITOT 0.4 11/30/2023   ALKPHOS 73 11/30/2023   AST 29 11/30/2023   ALT 26 11/30/2023   Last lipids Lab Results  Component Value Date   CHOL 267 (H) 11/30/2023   HDL 80 11/30/2023   LDLCALC 169 (H) 11/30/2023   TRIG 105 11/30/2023   CHOLHDL 3.3 11/30/2023  The 10-year ASCVD risk score (Arnett DK, et al., 2019) is: 1.8%  Last hemoglobin A1c Lab Results  Component Value Date   HGBA1C 5.7 (H) 11/30/2023   Last thyroid functions Lab Results  Component Value Date   TSH 1.170 11/30/2023        Objective    BP 125/79 (BP Location: Right Arm, Patient Position: Sitting, Cuff Size: Large)   Pulse 96   Ht 5\' 6"  (1.676 m)   Wt 230 lb 6.4 oz (104.5 kg)   LMP 09/07/2015   SpO2 98%   BMI 37.19 kg/m  BP Readings from Last 3 Encounters:  04/11/24 125/79  04/06/24 136/88  02/18/24 128/80   Wt Readings from Last 3 Encounters:  04/11/24 230 lb 6.4 oz (104.5 kg)  04/06/24 230 lb (104.3 kg)  02/18/24 230 lb (104.3 kg)        Physical Exam  Reviewed patient's pictures showing short dark hairs on   No results found for any visits on  04/11/24.  Assessment & Plan     Problem List Items Addressed This Visit       Musculoskeletal and Integument   Rheumatoid arthritis (HCC)   Inflammatory spondylopathy (HCC)   Relevant Orders   CBC   Sed Rate (ESR)     Other   Hypercholesterolemia   Relevant Orders   Lipid Panel With LDL/HDL Ratio   Elevated blood pressure  reading - Primary   Relevant Orders   Comprehensive metabolic panel with GFR   Other Visit Diagnoses       Female hirsutism       Relevant Orders   Hemoglobin A1c   TSH+T4F+T3Free   Testosterone , Free, Total, SHBG       Assessment & Plan Hirsutism Hirsutism with scalp hair thinning and increased facial hair growth. Previous testosterone  levels were normal. Differential diagnosis includes PCOS, thyroid dysfunction, or other endocrine disorders. Electrolysis has been ongoing for five years with variable results. Rogaine has been used since November for scalp hair thinning. Further evaluation with lab tests is necessary to determine underlying causes. - Order TSH, T4, T3, A1c, testosterone , and sex hormone binding globulin (SHBG) - Refer to endocrinology if labs are normal - Consider medication for hair growth if labs are normal and blood pressure is not too low  Prediabetes Last A1c in January was 5.7%, indicating prediabetes. This condition can be associated with PCOS. - Order A1c  Hypertension Blood pressure is well-controlled at 127 mmHg, within the goal range. No additional treatment is necessary at this time.  Hyperlipidemia Previous cholesterol levels from January 2025 showed total cholesterol at 260 mg/dL and LDL at 161 mg/dL. ASCVD risk score was 1.8%, indicating low risk for cardiovascular events. Crestor  (rosuvastatin ) 10 mg daily is well-tolerated without side effects such as aches or pains. Regular monitoring of cholesterol levels is necessary to ensure continued management of cardiovascular risk. - Order lipid panel - Continue Crestor  10  mg daily  Bulging cervical discs Bulging cervical discs with ongoing physical therapy and dry needling, which is reported to be helpful. Previous steroid injection did not provide relief. Continued physical therapy is recommended to manage symptoms.  General Health Maintenance Engaging in regular physical activity, walking four days a week for 30 minutes to an hour, which is beneficial for overall health. Colonoscopy is scheduled.  Follow-up Follow-up plan to reassess lab results and determine further management based on findings. Coordination with rheumatology for additional lab tests to avoid duplicate testing. - Schedule follow-up in six weeks - If labs are abnormal, proceed with endocrinology referral - If labs are normal, consider medication for hair growth     Return in about 6 weeks (around 05/23/2024) for hirsutism .         Mimi Alt, MD  [facility] [phone] (phone) [phone] (fax)  [facility]

## 2024-04-12 ENCOUNTER — Ambulatory Visit (INDEPENDENT_AMBULATORY_CARE_PROVIDER_SITE_OTHER): Admitting: Family Medicine

## 2024-04-12 ENCOUNTER — Encounter: Payer: Self-pay | Admitting: Family Medicine

## 2024-04-12 VITALS — BP 139/98 | HR 113 | Resp 16 | Wt 228.6 lb

## 2024-04-12 DIAGNOSIS — R222 Localized swelling, mass and lump, trunk: Secondary | ICD-10-CM | POA: Diagnosis not present

## 2024-04-12 LAB — CBC WITH DIFFERENTIAL/PLATELET
Basophils Absolute: 0 10*3/uL (ref 0.0–0.2)
Basos: 1 %
EOS (ABSOLUTE): 0.1 10*3/uL (ref 0.0–0.4)
Eos: 1 %
Hematocrit: 35.4 % (ref 34.0–46.6)
Hemoglobin: 11.6 g/dL (ref 11.1–15.9)
Immature Grans (Abs): 0 10*3/uL (ref 0.0–0.1)
Immature Granulocytes: 0 %
Lymphocytes Absolute: 1.5 10*3/uL (ref 0.7–3.1)
Lymphs: 34 %
MCH: 30.2 pg (ref 26.6–33.0)
MCHC: 32.8 g/dL (ref 31.5–35.7)
MCV: 92 fL (ref 79–97)
Monocytes Absolute: 0.4 10*3/uL (ref 0.1–0.9)
Monocytes: 10 %
Neutrophils Absolute: 2.4 10*3/uL (ref 1.4–7.0)
Neutrophils: 54 %
Platelets: 315 10*3/uL (ref 150–450)
RBC: 3.84 x10E6/uL (ref 3.77–5.28)
RDW: 13.1 % (ref 11.7–15.4)
WBC: 4.4 10*3/uL (ref 3.4–10.8)

## 2024-04-12 LAB — SEDIMENTATION RATE: Sed Rate: 10 mm/h (ref 0–40)

## 2024-04-12 LAB — COMPREHENSIVE METABOLIC PANEL WITH GFR
ALT: 21 IU/L (ref 0–32)
AST: 24 IU/L (ref 0–40)
Albumin: 4.4 g/dL (ref 3.8–4.9)
Alkaline Phosphatase: 68 IU/L (ref 44–121)
BUN/Creatinine Ratio: 17 (ref 9–23)
BUN: 14 mg/dL (ref 6–24)
Bilirubin Total: 0.6 mg/dL (ref 0.0–1.2)
CO2: 21 mmol/L (ref 20–29)
Calcium: 9.5 mg/dL (ref 8.7–10.2)
Chloride: 104 mmol/L (ref 96–106)
Creatinine, Ser: 0.82 mg/dL (ref 0.57–1.00)
Globulin, Total: 2.7 g/dL (ref 1.5–4.5)
Glucose: 97 mg/dL (ref 70–99)
Potassium: 4.3 mmol/L (ref 3.5–5.2)
Sodium: 140 mmol/L (ref 134–144)
Total Protein: 7.1 g/dL (ref 6.0–8.5)
eGFR: 84 mL/min/{1.73_m2} (ref >59)

## 2024-04-12 LAB — LIPID PANEL WITH LDL/HDL RATIO
Cholesterol, Total: 148 mg/dL (ref 100–199)
HDL: 76 mg/dL (ref >39)
LDL Chol Calc (NIH): 56 mg/dL (ref 0–99)
LDL/HDL Ratio: 0.7 ratio (ref 0.0–3.2)
Triglycerides: 86 mg/dL (ref 0–149)
VLDL Cholesterol Cal: 16 mg/dL (ref 5–40)

## 2024-04-12 LAB — TESTOSTERONE, FREE, TOTAL, SHBG
Sex Hormone Binding: 25.4 nmol/L (ref 17.3–125.0)
Testosterone, Free: 3.4 pg/mL (ref 0.0–4.2)
Testosterone: 15 ng/dL (ref 4–50)

## 2024-04-12 LAB — TSH+T4F+T3FREE
Free T4: 1.03 ng/dL (ref 0.82–1.77)
T3, Free: 3 pg/mL (ref 2.0–4.4)
TSH: 0.946 u[IU]/mL (ref 0.450–4.500)

## 2024-04-12 LAB — HEMOGLOBIN A1C
Est. average glucose Bld gHb Est-mCnc: 117 mg/dL
Hgb A1c MFr Bld: 5.7 % — ABNORMAL HIGH (ref 4.8–5.6)

## 2024-04-12 NOTE — Progress Notes (Signed)
      Established patient visit   Patient: Carolyn Hays   DOB: 1971/05/29   53 y.o. Female  MRN: [id number] Visit Date: 04/12/2024  Today's healthcare provider: Jeralene Mom, MD   Chief Complaint  Patient presents with   Lump    On back noticed by physical therapy.   Subjective    HPI  Patient presents for evaluation of lump on back of left shoulder that her physical therapist noticed a few weeks ago. Is not at all painful and she did not realized there was a lump until pointed out by PT.   Medications: Outpatient Medications Prior to Visit  Medication Sig   cetirizine (ZYRTEC) 10 MG tablet Take 10 mg by mouth.   Cholecalciferol (VITAMIN D3) 2000 UNITS capsule Take by mouth.   cyclobenzaprine  (FLEXERIL ) 10 MG tablet Take 1 tablet (10 mg total) by mouth 3 (three) times daily as needed for muscle spasms. This can make you sleepy.   DULOXETINE HCL PO Take 35 mg by mouth daily. Taking 35 mg   gabapentin (NEURONTIN) 100 MG capsule Take 100 mg by mouth as needed.   Magnesium 100 MG CAPS Take 400 mg by mouth.   minoxidil (ROGAINE) 2 % external solution Apply topically.   Omega-3 Fatty Acids (FISH OIL) 1000 MG CPDR Take 4,000 mg by mouth daily.   omeprazole (PRILOSEC) 20 MG capsule Take 20 mg by mouth daily.   rosuvastatin  (CRESTOR ) 10 MG tablet Take 1 tablet (10 mg total) by mouth daily.   Tofacitinib Citrate 5 MG TABS Xeljanz 5 mg tablet  TAKE ONE TABLET BY MOUTH TWICE DAILY. MAY BE TAKEN WITH OR WITHOUT FOOD. STORE AT ROOM TEMPERATURE.   Turmeric (QC TUMERIC COMPLEX PO) Take by mouth.   No facility-administered medications prior to visit.    Review of Systems     Objective    BP (!) 139/98 (BP Location: Left Arm, Patient Position: Sitting, Cuff Size: Large)   Pulse (!) 113   Resp 16   Wt 228 lb 9.6 oz (103.7 kg)   LMP 09/07/2015   SpO2 99%   BMI 36.90 kg/m    Physical Exam   Non-tender soft solid, homogenous grape sized mass just over left scapula c/with  lipoma. No erythema, no open wounds or discharge.    Assessment & Plan     1. Subcutaneous mass of left upper back (Primary) Asymptomatic. Exam c/with lipoma. Call if any change in size of character of lump.          Jeralene Mom, MD  [facility] [phone] (phone) [phone] (fax)  [facility]

## 2024-04-13 ENCOUNTER — Ambulatory Visit: Payer: Self-pay | Admitting: Family Medicine

## 2024-04-15 ENCOUNTER — Ambulatory Visit
Admission: RE | Admit: 2024-04-15 | Discharge: 2024-04-15 | Disposition: A | Discharge status: Home / Self Care | Source: Ambulatory Visit | Attending: Family Medicine | Admitting: Family Medicine

## 2024-04-15 DIAGNOSIS — Z1231 Encounter for screening mammogram for malignant neoplasm of breast: Secondary | ICD-10-CM | POA: Insufficient documentation

## 2024-04-25 ENCOUNTER — Ambulatory Visit: Payer: Self-pay | Admitting: Family Medicine

## 2024-04-29 NOTE — Progress Notes (Signed)
 Referring Physician:  Mimi Alt, MD [address] [place],  [place] [address]  Primary Physician:  Mimi Alt, MD  History of Present Illness: 05/03/2024 [name] is here today with a chief complaint of neck and arm discomfort.  She has had multiple different bouts of this.  She has had symptoms both in the right and her left arm, but her left arm is bothering her more now.  She has significant neck pain with activities.  Tilting her head to the left makes her pain worse.  She gets pain into her left shoulder blade and down the back of her left arm with neuropathic type pain.  She has numbness in her thumb and index finger.    History of Present Illness: 04/06/2024 Note from Lucetta Russel, [place] Carolyn Hays has a history of OSA, IBS, B12 neuropathy, carpal tunnel syndrome, RA, and high cholesterol.    Last seen by me on 02/18/24 for neck and LBP. She has known cervical spondylosis with multilevel foraminal stenosis, mild central stenosis C4-C6. She also has known lumbar spondylosis with left foraminal disc at L2-L3.    LBP was tolerable so she wanted to hold on treatment. She was sent to PT for her cervical spine and referred to PMR for possible injections.    Started PT at [facility] on 02/23/24 and they were going to try some dry needling. She saw Dr. Erman Hayward and had left C4-C5 TF ESI on 03/03/24.    She is here for follow up.    She feels like PT has helped the most with her pain. Did not see much relief with cervical ESI.    She continues with more constant neck pain with tingling in left arm to her hand. No arm pain. Pain is generally mild but can get up to a 7-8. Pain is worse with tilting her head to left side. No weakness in left arm.    She is feeling better, but still has bad days.    She is taking cymbalta, neurontin prn, flexeril  prn.    She does not smoke.      Conservative measures: saw a chiropractor for her neck  years ago Physical therapy: initial evaluation at [facility] for cervical spine on 02/23/24 and she is still going Multimodal medical therapy including regular antiinflammatories: flexeril , Cymbalta, neurontin Injections:  03/03/2024: Left C4-5 transforaminal ESI    Past Surgery: none   Carolyn Hays has no symptoms of cervical myelopathy.   The symptoms are causing a significant impact on the patient's life.   I have utilized the care everywhere function in epic to review the outside records available from external health systems.  Review of Systems:  A 10 point review of systems is negative, except for the pertinent positives and negatives detailed in the HPI.  Past Medical History: Past Medical History:  Diagnosis Date   Allergic rhinitis    Alopecia    Anxiety    Arthritis    Calculus of kidney    Carpal tunnel syndrome    Depression    Dyspareunia, female    Eczema    Family history of breast cancer    Neg BRCA/BART 2014; Neg MyRisk update 10/18; riskscore=19.2%/IBIS=19.1%   Family history of ovarian cancer    paternal aunt in her 48s   GERD (gastroesophageal reflux disease)    IBS (irritable bowel syndrome)    Vitamin B12 deficiency neuropathy (HCC)    Vitamin D  deficiency  Past Surgical History: Past Surgical History:  Procedure Laterality Date   BARTHOLIN GLAND CYST EXCISION  2007   CESAREAN SECTION  10/27/2010   CHOLECYSTECTOMY  10/2006   COLONOSCOPY  2012   one polyp ([facility])   Introital revision  10/26/2015   OTHER SURGICAL HISTORY  2020   BILATERAL VERICOSE VEIN TREATMENT VIA LASER.    Allergies: Allergies as of 05/03/2024 - Review Complete 05/03/2024  Allergen Reaction Noted   Indomethacin Other (See Comments) 09/11/2015    Medications:  Current Outpatient Medications:    cetirizine (ZYRTEC) 10 MG tablet, Take 10 mg by mouth., Disp: , Rfl:    Cholecalciferol (VITAMIN D3) 2000 UNITS capsule, Take by mouth., Disp: , Rfl:    DULOXETINE  HCL PO, Take 35 mg by mouth daily. Taking 35 mg, Disp: , Rfl:    gabapentin (NEURONTIN) 100 MG capsule, Take 100 mg by mouth as needed., Disp: , Rfl:    Magnesium 100 MG CAPS, Take 400 mg by mouth., Disp: , Rfl:    minoxidil (ROGAINE) 2 % external solution, Apply topically., Disp: , Rfl:    Omega-3 Fatty Acids (FISH OIL) 1000 MG CPDR, Take 4,000 mg by mouth daily., Disp: , Rfl:    omeprazole (PRILOSEC) 20 MG capsule, Take 20 mg by mouth daily., Disp: , Rfl:    rosuvastatin  (CRESTOR ) 10 MG tablet, Take 1 tablet (10 mg total) by mouth daily., Disp: 90 tablet, Rfl: 3   Tofacitinib Citrate 5 MG TABS, Xeljanz 5 mg tablet  TAKE ONE TABLET BY MOUTH TWICE DAILY. MAY BE TAKEN WITH OR WITHOUT FOOD. STORE AT ROOM TEMPERATURE., Disp: , Rfl:    Turmeric (QC TUMERIC COMPLEX PO), Take by mouth., Disp: , Rfl:   Social History: Social History   Tobacco Use   Smoking status: Never   Smokeless tobacco: Never  Vaping Use   Vaping status: Never Used  Substance Use Topics   Alcohol use: Not Currently    Alcohol/week: 0.0 - 1.0 standard drinks of alcohol    Comment: occasional   Drug use: No    Family Medical History: Family History  Problem Relation Age of Onset   Hypertension Mother    Diabetes Mother    Sleep apnea Mother    Hyperlipidemia Father    Sleep apnea Father    Colon cancer Father 68   Heart attack Father    Hypertension Father    Healthy Sister    Hypertension Brother    Cancer Maternal Grandfather        lung cancer in his 35s   Kidney disease Paternal Grandfather    Breast cancer Paternal Grandmother 3   Diabetes Paternal Aunt    Ovarian cancer Paternal Aunt 36   Lung cancer Other    Pancreatic cancer Other    Cancer Maternal Uncle 56       tongue   Prostate cancer Paternal Uncle        40s   Prostate cancer Paternal Uncle        5s    Physical Examination: Vitals:   05/03/24 1309  BP: 124/84    General: Patient is in no apparent distress. Attention to  examination is appropriate.  Neck:   Supple.  Full range of motion.  Respiratory: Patient is breathing without any difficulty.   NEUROLOGICAL:     Awake, alert, oriented to person, place, and time.  Speech is clear and fluent.   Cranial Nerves: Pupils equal round and reactive to light.  Facial tone is symmetric.  Facial sensation is symmetric. Shoulder shrug is symmetric. Tongue protrusion is midline.  There is no pronator drift.  Strength: Side Biceps Triceps Deltoid Interossei Grip Wrist Ext. Wrist Flex.  R 5 5 5 5 5 5 5   L 4+ 5 4+ 5 5 5 5    Side Iliopsoas Quads Hamstring PF DF EHL  R 5 5 5 5 5 5   L 5 5 5 5 5 5    Reflexes are 1+ and symmetric at the biceps, triceps, brachioradialis, patella and achilles.   Hoffman's is absent.   Bilateral upper and lower extremity sensation is intact to light touch but with tingling in L C5 and C6 distributions.    No evidence of dysmetria noted.  Gait is normal.     Medical Decision Making  Imaging: MRI CL spine 01/22/2024 Disc levels:   C2-3: No significant findings.   C3-4: Mild bulging uncovered disc with slight flattening of the ventral thecal sac but no significant spinal stenosis. Shallow disc osteophyte complexes bilaterally with mild bilateral foraminal narrowing.   C4-5: Large central disc protrusion with significant mass effect on the ventral thecal sac and ventral cervical spinal cord. Right-sided disc osteophyte complex is also present with significant right foraminal stenosis.   C5-6: Degenerative disc disease with bulging degenerated annulus and osteophytic ridging. There is flattening of the ventral thecal sac and narrowing of the ventral CSF space with mild spinal stenosis. Bilateral disc osteophyte complexes with moderate bilateral foraminal stenosis.   C6-7: Bulging degenerated and slightly uncovered disc with flattening of the ventral thecal sac and narrowing the ventral CSF space. Bilateral disc osteophyte  complexes with mild/moderate foraminal stenosis bilaterally.   C7-T1: No significant findings.   IMPRESSION: 1. Large central disc protrusion at C4-5 with significant mass effect on the ventral thecal sac and ventral cervical spinal cord. No cord edema or ischemic changes. 2. Significant right foraminal stenosis at C4-5. 3. Mild spinal stenosis and moderate bilateral foraminal stenosis at C5-6. 4. Mild/moderate foraminal stenosis bilaterally at C6-7.     Electronically Signed   By: Marrian Siva M.D.   On: 02/11/2024 12:23   Disc levels:   L1-2: No significant findings.   L2-3: There is a small focal left foraminal disc protrusion contacting and displacing the left L2 nerve root and likely causing the patient's left lower extremity radiculopathy. No spinal or right foraminal stenosis.   L3-4: No significant findings.   L4-5: No significant findings.   L5-S1: Mild facet disease but no disc protrusions, spinal or foraminal stenosis.   IMPRESSION: 1. Small focal left foraminal disc protrusion at L2-3 contacting and displacing the left L2 nerve root and likely causing the patient's left lower extremity radiculopathy. 2. No other significant findings.     Electronically Signed   By: Marrian Siva M.D.   On: 02/11/2024 12:08  I have personally reviewed the images and agree with the above interpretation.  Please note that I have measured the spinal canal at C4-5.  She has severe stenosis with a spinal canal diameter of 5.6 mm.  Assessment and Plan: Ms. Mohammed is a pleasant 53 y.o. female with cervical radiculopathy impacting the C5 and C6 nerve roots.  Additionally, she has severe stenosis at C4-5.  She has neuroforaminal stenosis at C4-5 and C5-6.  She has no evidence of instability on x-rays from 2023.  Will confirm this with flexion-extension x-rays.  She has tried and failed conservative management with physical therapy, medications, injections.  I have recommended  surgical intervention with C4-6 cervical disc arthroplasty.  I discussed the planned procedure at length with the patient, including the risks, benefits, alternatives, and indications. The risks discussed include but are not limited to bleeding, infection, need for reoperation, spinal fluid leak, stroke, vision loss, anesthetic complication, coma, paralysis, and even death. We also discussed the possibility of post-operative dysphagia, vocal cord paralysis, and the risk of adjacent segment disease in the future. I also described in detail that improvement was not guaranteed.  The patient expressed understanding of these risks, and asked that we proceed with surgery. I described the surgery in layman's terms, and gave ample opportunity for questions, which were answered to the best of my ability.  I spent a total of 30 minutes in this patient's care today. This time was spent reviewing pertinent records including imaging studies, obtaining and confirming history, performing a directed evaluation, formulating and discussing my recommendations, and documenting the visit within the medical record.       Thank you for involving me in the care of this patient.      Shanikka Wonders K. Mont Antis MD, [facility]

## 2024-04-29 NOTE — H&P (View-Only) (Signed)
 Referring Physician:  Mimi Alt, MD [address] [place],  [place] [address]  Primary Physician:  Mimi Alt, MD  History of Present Illness: 05/03/2024 [name] is here today with a chief complaint of neck and arm discomfort.  She has had multiple different bouts of this.  She has had symptoms both in the right and her left arm, but her left arm is bothering her more now.  She has significant neck pain with activities.  Tilting her head to the left makes her pain worse.  She gets pain into her left shoulder blade and down the back of her left arm with neuropathic type pain.  She has numbness in her thumb and index finger.    History of Present Illness: 04/06/2024 Note from Lucetta Russel, [place] [name] has a history of OSA, IBS, B12 neuropathy, carpal tunnel syndrome, RA, and high cholesterol.    Last seen by me on 02/18/24 for neck and LBP. She has known cervical spondylosis with multilevel foraminal stenosis, mild central stenosis C4-C6. She also has known lumbar spondylosis with left foraminal disc at L2-L3.    LBP was tolerable so she wanted to hold on treatment. She was sent to PT for her cervical spine and referred to PMR for possible injections.    Started PT at [facility] on 02/23/24 and they were going to try some dry needling. She saw Dr. Erman Hayward and had left C4-C5 TF ESI on 03/03/24.    She is here for follow up.    She feels like PT has helped the most with her pain. Did not see much relief with cervical ESI.    She continues with more constant neck pain with tingling in left arm to her hand. No arm pain. Pain is generally mild but can get up to a 7-8. Pain is worse with tilting her head to left side. No weakness in left arm.    She is feeling better, but still has bad days.    She is taking cymbalta, neurontin prn, flexeril  prn.    She does not smoke.      Conservative measures: saw a chiropractor for her neck  years ago Physical therapy: initial evaluation at [facility] for cervical spine on 02/23/24 and she is still going Multimodal medical therapy including regular antiinflammatories: flexeril , Cymbalta, neurontin Injections:  03/03/2024: Left C4-5 transforaminal ESI    Past Surgery: none   [name] has no symptoms of cervical myelopathy.   The symptoms are causing a significant impact on the patient's life.   I have utilized the care everywhere function in epic to review the outside records available from external health systems.  Review of Systems:  A 10 point review of systems is negative, except for the pertinent positives and negatives detailed in the HPI.  Past Medical History: Past Medical History:  Diagnosis Date   Allergic rhinitis    Alopecia    Anxiety    Arthritis    Calculus of kidney    Carpal tunnel syndrome    Depression    Dyspareunia, female    Eczema    Family history of breast cancer    Neg BRCA/BART 2014; Neg MyRisk update 10/18; riskscore=19.2%/IBIS=19.1%   Family history of ovarian cancer    paternal aunt in her 48s   GERD (gastroesophageal reflux disease)    IBS (irritable bowel syndrome)    Vitamin B12 deficiency neuropathy (HCC)    Vitamin D  deficiency  Past Surgical History: Past Surgical History:  Procedure Laterality Date   BARTHOLIN GLAND CYST EXCISION  2007   CESAREAN SECTION  10/27/2010   CHOLECYSTECTOMY  10/2006   COLONOSCOPY  2012   one polyp ([facility])   Introital revision  10/26/2015   OTHER SURGICAL HISTORY  2020   BILATERAL VERICOSE VEIN TREATMENT VIA LASER.    Allergies: Allergies as of 05/03/2024 - Review Complete 05/03/2024  Allergen Reaction Noted   Indomethacin Other (See Comments) 09/11/2015    Medications:  Current Outpatient Medications:    cetirizine (ZYRTEC) 10 MG tablet, Take 10 mg by mouth., Disp: , Rfl:    Cholecalciferol (VITAMIN D3) 2000 UNITS capsule, Take by mouth., Disp: , Rfl:    DULOXETINE  HCL PO, Take 35 mg by mouth daily. Taking 35 mg, Disp: , Rfl:    gabapentin (NEURONTIN) 100 MG capsule, Take 100 mg by mouth as needed., Disp: , Rfl:    Magnesium 100 MG CAPS, Take 400 mg by mouth., Disp: , Rfl:    minoxidil (ROGAINE) 2 % external solution, Apply topically., Disp: , Rfl:    Omega-3 Fatty Acids (FISH OIL) 1000 MG CPDR, Take 4,000 mg by mouth daily., Disp: , Rfl:    omeprazole (PRILOSEC) 20 MG capsule, Take 20 mg by mouth daily., Disp: , Rfl:    rosuvastatin  (CRESTOR ) 10 MG tablet, Take 1 tablet (10 mg total) by mouth daily., Disp: 90 tablet, Rfl: 3   Tofacitinib Citrate 5 MG TABS, Xeljanz 5 mg tablet  TAKE ONE TABLET BY MOUTH TWICE DAILY. MAY BE TAKEN WITH OR WITHOUT FOOD. STORE AT ROOM TEMPERATURE., Disp: , Rfl:    Turmeric (QC TUMERIC COMPLEX PO), Take by mouth., Disp: , Rfl:   Social History: Social History   Tobacco Use   Smoking status: Never   Smokeless tobacco: Never  Vaping Use   Vaping status: Never Used  Substance Use Topics   Alcohol use: Not Currently    Alcohol/week: 0.0 - 1.0 standard drinks of alcohol    Comment: occasional   Drug use: No    Family Medical History: Family History  Problem Relation Age of Onset   Hypertension Mother    Diabetes Mother    Sleep apnea Mother    Hyperlipidemia Father    Sleep apnea Father    Colon cancer Father 68   Heart attack Father    Hypertension Father    Healthy Sister    Hypertension Brother    Cancer Maternal Grandfather        lung cancer in his 35s   Kidney disease Paternal Grandfather    Breast cancer Paternal Grandmother 3   Diabetes Paternal Aunt    Ovarian cancer Paternal Aunt 36   Lung cancer Other    Pancreatic cancer Other    Cancer Maternal Uncle 56       tongue   Prostate cancer Paternal Uncle        40s   Prostate cancer Paternal Uncle        5s    Physical Examination: Vitals:   05/03/24 1309  BP: 124/84    General: Patient is in no apparent distress. Attention to  examination is appropriate.  Neck:   Supple.  Full range of motion.  Respiratory: Patient is breathing without any difficulty.   NEUROLOGICAL:     Awake, alert, oriented to person, place, and time.  Speech is clear and fluent.   Cranial Nerves: Pupils equal round and reactive to light.  Facial tone is symmetric.  Facial sensation is symmetric. Shoulder shrug is symmetric. Tongue protrusion is midline.  There is no pronator drift.  Strength: Side Biceps Triceps Deltoid Interossei Grip Wrist Ext. Wrist Flex.  R 5 5 5 5 5 5 5   L 4+ 5 4+ 5 5 5 5    Side Iliopsoas Quads Hamstring PF DF EHL  R 5 5 5 5 5 5   L 5 5 5 5 5 5    Reflexes are 1+ and symmetric at the biceps, triceps, brachioradialis, patella and achilles.   Hoffman's is absent.   Bilateral upper and lower extremity sensation is intact to light touch but with tingling in L C5 and C6 distributions.    No evidence of dysmetria noted.  Gait is normal.     Medical Decision Making  Imaging: MRI CL spine 01/22/2024 Disc levels:   C2-3: No significant findings.   C3-4: Mild bulging uncovered disc with slight flattening of the ventral thecal sac but no significant spinal stenosis. Shallow disc osteophyte complexes bilaterally with mild bilateral foraminal narrowing.   C4-5: Large central disc protrusion with significant mass effect on the ventral thecal sac and ventral cervical spinal cord. Right-sided disc osteophyte complex is also present with significant right foraminal stenosis.   C5-6: Degenerative disc disease with bulging degenerated annulus and osteophytic ridging. There is flattening of the ventral thecal sac and narrowing of the ventral CSF space with mild spinal stenosis. Bilateral disc osteophyte complexes with moderate bilateral foraminal stenosis.   C6-7: Bulging degenerated and slightly uncovered disc with flattening of the ventral thecal sac and narrowing the ventral CSF space. Bilateral disc osteophyte  complexes with mild/moderate foraminal stenosis bilaterally.   C7-T1: No significant findings.   IMPRESSION: 1. Large central disc protrusion at C4-5 with significant mass effect on the ventral thecal sac and ventral cervical spinal cord. No cord edema or ischemic changes. 2. Significant right foraminal stenosis at C4-5. 3. Mild spinal stenosis and moderate bilateral foraminal stenosis at C5-6. 4. Mild/moderate foraminal stenosis bilaterally at C6-7.     Electronically Signed   By: Marrian Siva M.D.   On: 02/11/2024 12:23   Disc levels:   L1-2: No significant findings.   L2-3: There is a small focal left foraminal disc protrusion contacting and displacing the left L2 nerve root and likely causing the patient's left lower extremity radiculopathy. No spinal or right foraminal stenosis.   L3-4: No significant findings.   L4-5: No significant findings.   L5-S1: Mild facet disease but no disc protrusions, spinal or foraminal stenosis.   IMPRESSION: 1. Small focal left foraminal disc protrusion at L2-3 contacting and displacing the left L2 nerve root and likely causing the patient's left lower extremity radiculopathy. 2. No other significant findings.     Electronically Signed   By: Marrian Siva M.D.   On: 02/11/2024 12:08  I have personally reviewed the images and agree with the above interpretation.  Please note that I have measured the spinal canal at C4-5.  She has severe stenosis with a spinal canal diameter of 5.6 mm.  Assessment and Plan: Ms. Mohammed is a pleasant 53 y.o. female with cervical radiculopathy impacting the C5 and C6 nerve roots.  Additionally, she has severe stenosis at C4-5.  She has neuroforaminal stenosis at C4-5 and C5-6.  She has no evidence of instability on x-rays from 2023.  Will confirm this with flexion-extension x-rays.  She has tried and failed conservative management with physical therapy, medications, injections.  I have recommended  surgical intervention with C4-6 cervical disc arthroplasty.  I discussed the planned procedure at length with the patient, including the risks, benefits, alternatives, and indications. The risks discussed include but are not limited to bleeding, infection, need for reoperation, spinal fluid leak, stroke, vision loss, anesthetic complication, coma, paralysis, and even death. We also discussed the possibility of post-operative dysphagia, vocal cord paralysis, and the risk of adjacent segment disease in the future. I also described in detail that improvement was not guaranteed.  The patient expressed understanding of these risks, and asked that we proceed with surgery. I described the surgery in layman's terms, and gave ample opportunity for questions, which were answered to the best of my ability.  I spent a total of 30 minutes in this patient's care today. This time was spent reviewing pertinent records including imaging studies, obtaining and confirming history, performing a directed evaluation, formulating and discussing my recommendations, and documenting the visit within the medical record.       Thank you for involving me in the care of this patient.      Shanikka Wonders K. Mont Antis MD, [facility]

## 2024-05-03 ENCOUNTER — Ambulatory Visit
Admission: RE | Admit: 2024-05-03 | Discharge: 2024-05-03 | Disposition: A | Discharge status: Home / Self Care | Source: Ambulatory Visit | Attending: Neurosurgery | Admitting: Neurosurgery

## 2024-05-03 ENCOUNTER — Encounter: Payer: Self-pay | Admitting: Neurosurgery

## 2024-05-03 ENCOUNTER — Ambulatory Visit (INDEPENDENT_AMBULATORY_CARE_PROVIDER_SITE_OTHER): Admitting: Neurosurgery

## 2024-05-03 ENCOUNTER — Other Ambulatory Visit: Payer: Self-pay

## 2024-05-03 ENCOUNTER — Ambulatory Visit
Admission: RE | Admit: 2024-05-03 | Discharge: 2024-05-03 | Disposition: A | Discharge status: Home / Self Care | Attending: Neurosurgery | Admitting: Neurosurgery

## 2024-05-03 VITALS — BP 124/84 | Ht 66.0 in | Wt 228.0 lb

## 2024-05-03 DIAGNOSIS — M5412 Radiculopathy, cervical region: Secondary | ICD-10-CM

## 2024-05-03 DIAGNOSIS — Z01818 Encounter for other preprocedural examination: Secondary | ICD-10-CM

## 2024-05-03 DIAGNOSIS — M4802 Spinal stenosis, cervical region: Secondary | ICD-10-CM | POA: Diagnosis present

## 2024-05-03 NOTE — Patient Instructions (Signed)
 Please see below for information in regards to your upcoming surgery:   Planned surgery: C4-6 arthroplasty   Surgery date: 06/13/24 at [facility]: [address] [place], [place] [address]) - you will find out your arrival time the business day before your surgery.   Pre-op appointment at [facility] Pre-admit Testing: you will receive a call with a date/time for this appointment. If you are scheduled for an in person appointment, Pre-admit Testing is located on the first floor of the [facility] building, [address] [facility], [address]. During this appointment, they will advise you which medications you can take the morning of surgery, and which medications you will need to hold for surgery. Labs (such as blood work, EKG) may be done at your pre-op appointment. You are not required to fast for these labs. Should you need to change your pre-op appointment, please call Pre-admit testing at [phone].     Cloria Danger: stop 3 days before surgery and do not take it the morning of surgery (4 days total). You can restart this about 2 weeks after surgery (once you have come in for your first post-op appointment).     Common restrictions after surgery: No bending, lifting, or twisting ("BLT"). Avoid lifting objects heavier than 10 pounds for the first 6 weeks after surgery. Where possible, avoid household activities that involve lifting, bending, reaching, pushing, or pulling such as laundry, vacuuming, grocery shopping, and childcare. Try to arrange for help from friends and family for these activities while you heal. Do not drive while taking prescription pain medication. Weeks 6 through 12 after surgery: avoid lifting more than 25 pounds.    X-rays after surgery: Because you are having an arthroplasty: for appointments after your 2 week follow-up: please arrive at the [facility] outpatient imaging center ([address], [address], [place]) or  [facility] one hour prior to your appointment for x-rays. This applies to every appointment after your 2 week follow-up. Failure to do so may result in your appointment being rescheduled.   How to contact us :  If you have any questions/concerns before or after surgery, you can reach us  at [phone], or you can send a mychart message. We can be reached by phone or mychart 8am-4pm, Monday-Friday.  *Please note: Calls after 4pm are forwarded to a third party answering service. Mychart messages are not routinely monitored during evenings, weekends, and holidays. Please call our office to contact the answering service for urgent concerns during non-business hours.    If you have FMLA/disability paperwork, please drop it off or fax it to [phone], attention Patty.   Appointments/FMLA & disability paperwork: Gerlean Kocher, & Maryann Smalls Registered Nurses/Surgery schedulers: Dreshawn Hendershott & Lauren Medical Assistants: Donnajean Fuse Physician Assistants: Ludwig Safer, PA-C, Anastacio Karvonen, PA-C & Lucetta Russel, PA-C Surgeons: Jodeen Munch, MD & Henderson Lock, MD   [facility] PREADMIT TESTING VISIT and SURGERY INFORMATION SHEET   Now that surgery has been scheduled you can anticipate several phone calls from [employer] services. A pharmacy technician will call you to verify your current list of medications taken at home.               The Pre-Service Center will call to verify your insurance information and to give you billing estimates and information.             The Preadmit Testing Office will be calling to schedule a visit to obtain information for the  anesthesia team and provide instructions on preparation for surgery.  What can you expect for the Preadmit Testing Visit: Appointments may be scheduled in-person or by telephone.  If a telephone visit is scheduled, you may be asked to come into the office to have lab tests or other studies performed.    This visit will not be completed any greater than 14 days prior to your surgery.  If your surgery has been scheduled for a future date, please do not be alarmed if we have not contacted you to schedule an appointment more than a month prior to the surgery date.    Please be prepared to provide the following information during this appointment:            -Personal medical history                                               -Medication and allergy list            -Any history of problems with anesthesia              -Recent lab work or diagnostic studies            -Please notify us  of any needs we should be aware of to provide the best care possible           -You will be provided with instructions on how to prepare for your surgery.    On The Day of Surgery:  You must have a driver to take you home after surgery, you will be asked not to drive for 24 hours following surgery.  Taxi, [employer] and non-medical transport will not be acceptable means of transportation unless you have a responsible individual who will be traveling with you.  Visitors in the surgical area:   2 people will be able to visit you in your room once your preparation for surgery has been completed. During surgery, your visitors will be asked to wait in the Surgery Waiting Area.  It is not a requirement for them to stay, if they prefer to leave and come back.  Your visitor(s) will be given an update once the surgery has been completed.  No visitors are allowed in the initial recovery room to respect patient privacy and safety.  Once you are more awake and transfer to the secondary recovery area, or are transferred to an inpatient room, visitors will again be able to see you.  To respect and protect your privacy: We will ask on the day of surgery who your driver will be and what the contact number for that individual will be. We will ask if it is okay to share information with this individual, or if there is an alternative  individual that we, or the surgeon, should contact to provide updates and information. If family or friends come to the surgical information desk requesting information about you, who you have not listed with us , no information will be given.   It may be helpful to designate someone as the main contact who will be responsible for updating your other friends and family.    PREADMIT TESTING OFFICE: [phone] SAME DAY SURGERY: [phone] We look forward to caring for you before and throughout the process of your surgery.

## 2024-05-03 NOTE — Addendum Note (Signed)
 Addended by: Ivey Cina on: 05/03/2024 01:59 PM   Modules accepted: Orders

## 2024-05-06 ENCOUNTER — Telehealth: Payer: Self-pay

## 2024-05-06 NOTE — Telephone Encounter (Signed)
 Patient returned my phone call. She would like to move her surgery up to 05/16/2024.   I will contact centralized scheduling to make this change and contact PAT to have her appointment made for next week.

## 2024-05-10 ENCOUNTER — Encounter
Admission: RE | Admit: 2024-05-10 | Discharge: 2024-05-10 | Disposition: A | Discharge status: Home / Self Care | Source: Ambulatory Visit | Attending: Neurosurgery | Admitting: Neurosurgery

## 2024-05-10 ENCOUNTER — Other Ambulatory Visit: Payer: Self-pay

## 2024-05-10 DIAGNOSIS — Z01812 Encounter for preprocedural laboratory examination: Secondary | ICD-10-CM | POA: Diagnosis not present

## 2024-05-10 DIAGNOSIS — Z01818 Encounter for other preprocedural examination: Secondary | ICD-10-CM | POA: Diagnosis present

## 2024-05-10 HISTORY — DX: Personal history of urinary calculi: Z87.442

## 2024-05-10 HISTORY — DX: Sleep apnea, unspecified: G47.30

## 2024-05-10 LAB — SURGICAL PCR SCREEN
MRSA, PCR: NEGATIVE
Staphylococcus aureus: NEGATIVE

## 2024-05-10 NOTE — Patient Instructions (Signed)
 Your procedure is scheduled on: Monday 05/16/24 To find out your arrival time, please call [phone] between 1PM - 3PM on:   Friday 05/13/24 Report to the Registration Desk on the 1st floor of the [facility]. Free Valet parking is available.  If your arrival time is 6:00 am, do not arrive before that time as the [facility] entrance doors do not open until 6:00 am.  REMEMBER: Instructions that are not followed completely may result in serious medical risk, up to and including death; or upon the discretion of your surgeon and anesthesiologist your surgery may need to be rescheduled.  Do not eat food after midnight the night before surgery.  No gum chewing or hard candies.  You may however, drink CLEAR liquids up to 2 hours before you are scheduled to arrive for your surgery. Do not drink anything within 2 hours of your scheduled arrival time.  Clear liquids include: - water  - apple juice without pulp - gatorade (not RED colors) - black coffee or tea (Do NOT add milk or creamers to the coffee or tea) Do NOT drink anything that is not on this list.  One week prior to surgery: Stop Anti-inflammatories (NSAIDS) such as Advil , Aleve, Ibuprofen , Motrin , Naproxen, Naprosyn and Aspirin based products such as Excedrin, Goody's Powder, BC Powder. You may however, continue to take Tylenol if needed for pain up until the day of surgery.  Stop ANY OVER THE COUNTER supplements and vitamins for 7 days until after surgery.  Continue taking all prescribed medications with the exception of the following: Cloria Danger, last dose wil be Thursday 05/12/24  TAKE ONLY THESE MEDICATIONS THE MORNING OF SURGERY WITH A SIP OF WATER:  DULoxetine HCl 40 MG CPEP  omeprazole (PRILOSEC) 20 MG capsule Antacid (take one the night before and one on the morning of surgery - helps to prevent nausea after surgery.)  No Alcohol for 24 hours before or after surgery.  No Smoking including e-cigarettes for 24 hours  before surgery.  No chewable tobacco products for at least 6 hours before surgery.  No nicotine patches on the day of surgery.  Do not use any recreational drugs for at least a week (preferably 2 weeks) before your surgery.  Please be advised that the combination of cocaine and anesthesia may have negative outcomes, up to and including death. If you test positive for cocaine, your surgery will be cancelled.  On the morning of surgery brush your teeth with toothpaste and water, you may rinse your mouth with mouthwash if you wish. Do not swallow any toothpaste or mouthwash.  Use CHG Soap or wipes as directed on instruction sheet.  Do not wear lotions, powders, or perfumes.   Do not shave body hair from the neck down 48 hours before surgery.  Wear comfortable clothing (specific to your surgery type) to the hospital.  Do not wear jewelry, make-up, hairpins, clips or nail polish.  For welded (permanent) jewelry: bracelets, anklets, waist bands, etc.  Please have this removed prior to surgery.  If it is not removed, there is a chance that hospital personnel will need to cut it off on the day of surgery. Contact lenses, hearing aids and dentures may not be worn into surgery.  Bring your C-PAP to the hospital in case you may have to spend the night.   Do not bring valuables to the hospital. [facility] is not responsible for any missing/lost belongings or valuables.   Notify your doctor if there is any  change in your medical condition (cold, fever, infection).  If you are being discharged the day of surgery, you will not be allowed to drive home. You will need a responsible individual to drive you home and stay with you for 24 hours after surgery.   In case of increased patient census, it may be necessary for you, the patient, to continue your postoperative care in the Same Day Surgery department.  After surgery, you can help prevent lung complications by doing breathing exercises.   Take deep breaths and cough every 1-2 hours. Your doctor may order a device called an Incentive Spirometer to help you take deep breaths.  Surgery Visitation Policy:  Patients undergoing a surgery or procedure may have two family members or support persons with them as long as the person is not COVID-19 positive or experiencing its symptoms.   Please call the Pre-admissions Testing Dept. at [phone] if you have any questions about these instructions.    Pre-operative 5 CHG Bath Instructions   You can play a key role in reducing the risk of infection after surgery. Your skin needs to be as free of germs as possible. You can reduce the number of germs on your skin by washing with CHG (chlorhexidine gluconate) soap before surgery. CHG is an antiseptic soap that kills germs and continues to kill germs even after washing.   DO NOT use if you have an allergy to chlorhexidine/CHG or antibacterial soaps. If your skin becomes reddened or irritated, stop using the CHG and notify one of our RNs at [phone].   Please shower with the CHG soap starting 4 days before surgery using the following schedule:   Thursday 05/12/24 - Monday 05/16/24    Please keep in mind the following:  DO NOT shave, including legs and underarms, starting the day of your first shower.   You may shave your face at any point before/day of surgery.  Place clean sheets on your bed the day you start using CHG soap. Use a clean washcloth (not used since being washed) for each shower. DO NOT sleep with pets once you start using the CHG.   CHG Shower Instructions:  If you choose to wash your hair and private area, wash first with your normal shampoo/soap.  After you use shampoo/soap, rinse your hair and body thoroughly to remove shampoo/soap residue.  Turn the water OFF and apply about 3 tablespoons (45 ml) of CHG soap to a CLEAN washcloth.  Apply CHG soap ONLY FROM YOUR NECK DOWN TO YOUR TOES (washing for 3-5 minutes)   DO NOT use CHG soap on face, private areas, open wounds, or sores.  Pay special attention to the area where your surgery is being performed.  If you are having back surgery, having someone wash your back for you may be helpful. Wait 2 minutes after CHG soap is applied, then you may rinse off the CHG soap.  Pat dry with a clean towel  Put on clean clothes/pajamas   If you choose to wear lotion, please use ONLY the CHG-compatible lotions on the back of this paper.     Additional instructions for the day of surgery: DO NOT APPLY any lotions, deodorants, cologne, or perfumes.   Put on clean/comfortable clothes.  Brush your teeth.  Ask your nurse before applying any prescription medications to the skin.      CHG Compatible Lotions   Aveeno Moisturizing lotion  Cetaphil Moisturizing Cream  Cetaphil Moisturizing Lotion  Clairol Herbal Essence Moisturizing Lotion,  Dry Skin  Clairol Herbal Essence Moisturizing Lotion, Extra Dry Skin  Clairol Herbal Essence Moisturizing Lotion, Normal Skin  Curel Age Defying Therapeutic Moisturizing Lotion with Alpha Hydroxy  Curel Extreme Care Body Lotion  Curel Soothing Hands Moisturizing Hand Lotion  Curel Therapeutic Moisturizing Cream, Fragrance-Free  Curel Therapeutic Moisturizing Lotion, Fragrance-Free  Curel Therapeutic Moisturizing Lotion, Original Formula  Eucerin Daily Replenishing Lotion  Eucerin Dry Skin Therapy Plus Alpha Hydroxy Crme  Eucerin Dry Skin Therapy Plus Alpha Hydroxy Lotion  Eucerin Original Crme  Eucerin Original Lotion  Eucerin Plus Crme Eucerin Plus Lotion  Eucerin TriLipid Replenishing Lotion  Keri Anti-Bacterial Hand Lotion  Keri Deep Conditioning Original Lotion Dry Skin Formula Softly Scented  Keri Deep Conditioning Original Lotion, Fragrance Free Sensitive Skin Formula  Keri Lotion Fast Absorbing Fragrance Free Sensitive Skin Formula  Keri Lotion Fast Absorbing Softly Scented Dry Skin Formula  Keri Original  Lotion  Keri Skin Renewal Lotion Keri Silky Smooth Lotion  Keri Silky Smooth Sensitive Skin Lotion  Nivea Body Creamy Conditioning Oil  Nivea Body Extra Enriched Teacher, adult education Moisturizing Lotion Nivea Crme  Nivea Skin Firming Lotion  NutraDerm 30 Skin Lotion  NutraDerm Skin Lotion  NutraDerm Therapeutic Skin Cream  NutraDerm Therapeutic Skin Lotion  ProShield Protective Hand Cream  Provon moisturizing lotion

## 2024-05-13 MED ORDER — DEXMEDETOMIDINE HCL IN NACL 80 MCG/20ML IV SOLN
INTRAVENOUS | Status: AC
  Filled 2024-05-13: qty 20

## 2024-05-15 MED ORDER — LACTATED RINGERS IV SOLN: INTRAVENOUS | Status: DC

## 2024-05-15 MED ORDER — ORAL CARE MOUTH RINSE
15.0000 mL | Freq: Once | OROMUCOSAL | Status: AC
Start: 2024-05-15 — End: 2024-05-16

## 2024-05-15 MED ORDER — CHLORHEXIDINE GLUCONATE 0.12 % MT SOLN
15.0000 mL | Freq: Once | OROMUCOSAL | Status: AC
Start: 2024-05-15 — End: 2024-05-16
  Administered 2024-05-16: 15 mL via OROMUCOSAL

## 2024-05-15 MED ORDER — CEFAZOLIN IN SODIUM CHLORIDE 2-0.9 GM/100ML-% IV SOLN
2.0000 g | Freq: Once | INTRAVENOUS | Status: DC
  Filled 2024-05-15: qty 100

## 2024-05-15 MED ORDER — CEFAZOLIN SODIUM-DEXTROSE 2-4 GM/100ML-% IV SOLN
2.0000 g | INTRAVENOUS | Status: AC
  Administered 2024-05-16: 2 g via INTRAVENOUS

## 2024-05-16 ENCOUNTER — Other Ambulatory Visit: Payer: Self-pay

## 2024-05-16 ENCOUNTER — Ambulatory Visit

## 2024-05-16 ENCOUNTER — Encounter
Admission: RE | Disposition: A | Discharge status: Home / Self Care | Payer: Self-pay | Source: Home / Self Care | Attending: Neurosurgery

## 2024-05-16 ENCOUNTER — Inpatient Hospital Stay
Admission: RE | Admit: 2024-05-16 | Discharge: 2024-05-18 | DRG: 909 | Disposition: A | Discharge status: Home / Self Care | Attending: Neurosurgery | Admitting: Neurosurgery

## 2024-05-16 ENCOUNTER — Encounter: Payer: Self-pay | Admitting: Neurosurgery

## 2024-05-16 DIAGNOSIS — G9741 Accidental puncture or laceration of dura during a procedure: Principal | ICD-10-CM | POA: Diagnosis present

## 2024-05-16 DIAGNOSIS — M5412 Radiculopathy, cervical region: Secondary | ICD-10-CM | POA: Diagnosis not present

## 2024-05-16 DIAGNOSIS — M4722 Other spondylosis with radiculopathy, cervical region: Secondary | ICD-10-CM | POA: Diagnosis present

## 2024-05-16 DIAGNOSIS — Y838 Other surgical procedures as the cause of abnormal reaction of the patient, or of later complication, without mention of misadventure at the time of the procedure: Secondary | ICD-10-CM | POA: Diagnosis present

## 2024-05-16 DIAGNOSIS — G9600 Cerebrospinal fluid leak, unspecified: Secondary | ICD-10-CM

## 2024-05-16 DIAGNOSIS — E7800 Pure hypercholesterolemia, unspecified: Secondary | ICD-10-CM | POA: Diagnosis present

## 2024-05-16 DIAGNOSIS — G4733 Obstructive sleep apnea (adult) (pediatric): Secondary | ICD-10-CM | POA: Diagnosis present

## 2024-05-16 DIAGNOSIS — M4802 Spinal stenosis, cervical region: Secondary | ICD-10-CM | POA: Diagnosis present

## 2024-05-16 DIAGNOSIS — Y92234 Operating room of hospital as the place of occurrence of the external cause: Secondary | ICD-10-CM | POA: Diagnosis present

## 2024-05-16 DIAGNOSIS — Z01818 Encounter for other preprocedural examination: Principal | ICD-10-CM

## 2024-05-16 DIAGNOSIS — R110 Nausea: Secondary | ICD-10-CM

## 2024-05-16 HISTORY — PX: PLACEMENT OF LUMBAR DRAIN: SHX6028

## 2024-05-16 HISTORY — PX: CERVICAL ANTERIOR DISC ARTHROPLASTY, 2 LEVEL: SHX7538

## 2024-05-16 LAB — GLUCOSE, CAPILLARY: Glucose-Capillary: 153 mg/dL — ABNORMAL HIGH (ref 70–99)

## 2024-05-16 MED ORDER — DEXAMETHASONE SODIUM PHOSPHATE 10 MG/ML IJ SOLN
INTRAMUSCULAR | Status: DC | PRN
  Administered 2024-05-16: 10 mg via INTRAVENOUS

## 2024-05-16 MED ORDER — MAGNESIUM OXIDE -MG SUPPLEMENT 400 (240 MG) MG PO TABS
400.0000 mg | ORAL_TABLET | Freq: Every day | ORAL | Status: DC
  Administered 2024-05-17: 400 mg via ORAL
  Filled 2024-05-16 (×2): qty 1

## 2024-05-16 MED ORDER — CHLORHEXIDINE GLUCONATE CLOTH 2 % EX PADS
6.0000 | MEDICATED_PAD | Freq: Every day | CUTANEOUS | Status: DC
  Administered 2024-05-16: 6 via TOPICAL

## 2024-05-16 MED ORDER — ONDANSETRON HCL 4 MG/2ML IJ SOLN: 4.0000 mg | Freq: Once | INTRAMUSCULAR | Status: DC | PRN

## 2024-05-16 MED ORDER — ORAL CARE MOUTH RINSE: 15.0000 mL | OROMUCOSAL | Status: DC | PRN

## 2024-05-16 MED ORDER — FENTANYL CITRATE (PF) 100 MCG/2ML IJ SOLN
INTRAMUSCULAR | Status: DC | PRN
  Administered 2024-05-16 (×2): 50 ug via INTRAVENOUS

## 2024-05-16 MED ORDER — FENTANYL CITRATE (PF) 100 MCG/2ML IJ SOLN
INTRAMUSCULAR | Status: AC
Start: 2024-05-16 — End: 2024-05-16
  Filled 2024-05-16: qty 2

## 2024-05-16 MED ORDER — PHENYLEPHRINE HCL-NACL 20-0.9 MG/250ML-% IV SOLN
INTRAVENOUS | Status: DC | PRN
  Administered 2024-05-16: 40 ug/min via INTRAVENOUS
  Administered 2024-05-16 (×2): 80 ug via INTRAVENOUS

## 2024-05-16 MED ORDER — HYDROMORPHONE HCL 1 MG/ML IJ SOLN: 0.5000 mg | INTRAMUSCULAR | Status: AC | PRN

## 2024-05-16 MED ORDER — MENTHOL 3 MG MT LOZG: 1.0000 | LOZENGE | OROMUCOSAL | Status: DC | PRN

## 2024-05-16 MED ORDER — ONDANSETRON HCL 4 MG PO TABS: 4.0000 mg | ORAL_TABLET | Freq: Four times a day (QID) | ORAL | Status: DC | PRN

## 2024-05-16 MED ORDER — OXYCODONE HCL 5 MG PO TABS
5.0000 mg | ORAL_TABLET | ORAL | Status: DC | PRN
  Administered 2024-05-16: 5 mg via ORAL
  Filled 2024-05-16: qty 1

## 2024-05-16 MED ORDER — GABAPENTIN 100 MG PO CAPS: 100.0000 mg | ORAL_CAPSULE | Freq: Every evening | ORAL | Status: DC | PRN

## 2024-05-16 MED ORDER — MIDAZOLAM HCL 2 MG/2ML IJ SOLN
INTRAMUSCULAR | Status: DC | PRN
  Administered 2024-05-16: 2 mg via INTRAVENOUS

## 2024-05-16 MED ORDER — REMIFENTANIL HCL 1 MG IV SOLR
INTRAVENOUS | Status: AC
  Filled 2024-05-16: qty 1000

## 2024-05-16 MED ORDER — POLYETHYLENE GLYCOL 3350 17 G PO PACK: 17.0000 g | PACK | Freq: Every day | ORAL | Status: DC | PRN

## 2024-05-16 MED ORDER — SODIUM CHLORIDE 0.9% FLUSH: 3.0000 mL | INTRAVENOUS | Status: DC | PRN

## 2024-05-16 MED ORDER — KETAMINE HCL 50 MG/5ML IJ SOSY
PREFILLED_SYRINGE | INTRAMUSCULAR | Status: AC
  Filled 2024-05-16: qty 5

## 2024-05-16 MED ORDER — PHENYLEPHRINE HCL-NACL 20-0.9 MG/250ML-% IV SOLN
INTRAVENOUS | Status: AC
  Filled 2024-05-16: qty 250

## 2024-05-16 MED ORDER — SUCCINYLCHOLINE CHLORIDE 200 MG/10ML IV SOSY
PREFILLED_SYRINGE | INTRAVENOUS | Status: AC
  Filled 2024-05-16: qty 10

## 2024-05-16 MED ORDER — ONDANSETRON HCL 4 MG/2ML IJ SOLN
INTRAMUSCULAR | Status: AC
  Filled 2024-05-16: qty 2

## 2024-05-16 MED ORDER — DULOXETINE HCL 20 MG PO CPEP
40.0000 mg | ORAL_CAPSULE | Freq: Every day | ORAL | Status: DC
Start: 2024-05-17 — End: 2024-05-18
  Administered 2024-05-17 – 2024-05-18 (×2): 40 mg via ORAL
  Filled 2024-05-16 (×3): qty 2

## 2024-05-16 MED ORDER — FENTANYL CITRATE (PF) 100 MCG/2ML IJ SOLN
25.0000 ug | INTRAMUSCULAR | Status: DC | PRN
  Administered 2024-05-16: 50 ug via INTRAVENOUS
  Administered 2024-05-16 (×2): 25 ug via INTRAVENOUS

## 2024-05-16 MED ORDER — ACETAMINOPHEN 650 MG RE SUPP: 650.0000 mg | RECTAL | Status: DC | PRN

## 2024-05-16 MED ORDER — CHLORHEXIDINE GLUCONATE 0.12 % MT SOLN
OROMUCOSAL | Status: AC
  Filled 2024-05-16: qty 15

## 2024-05-16 MED ORDER — ONDANSETRON HCL 4 MG/2ML IJ SOLN
4.0000 mg | Freq: Four times a day (QID) | INTRAMUSCULAR | Status: DC | PRN
  Administered 2024-05-16 – 2024-05-17 (×4): 4 mg via INTRAVENOUS
  Filled 2024-05-16 (×4): qty 2

## 2024-05-16 MED ORDER — FIBRIN SEALANT 2 ML SINGLE DOSE KIT
PACK | CUTANEOUS | Status: AC
Start: 2024-05-16 — End: 2024-05-16
  Filled 2024-05-16: qty 2

## 2024-05-16 MED ORDER — SURGIFLO WITH THROMBIN (HEMOSTATIC MATRIX KIT) OPTIME
TOPICAL | Status: DC | PRN
  Administered 2024-05-16: 1 via TOPICAL

## 2024-05-16 MED ORDER — KETAMINE HCL 50 MG/5ML IJ SOSY
PREFILLED_SYRINGE | INTRAMUSCULAR | Status: DC | PRN
Start: 2024-05-16 — End: 2024-05-16
  Administered 2024-05-16: 50 mg via INTRAVENOUS

## 2024-05-16 MED ORDER — PHENOL 1.4 % MT LIQD: 1.0000 | OROMUCOSAL | Status: DC | PRN

## 2024-05-16 MED ORDER — SODIUM CHLORIDE 0.9 % IV SOLN: 250.0000 mL | INTRAVENOUS | Status: AC

## 2024-05-16 MED ORDER — LORATADINE 10 MG PO TABS
10.0000 mg | ORAL_TABLET | Freq: Every day | ORAL | Status: DC
  Filled 2024-05-16: qty 1

## 2024-05-16 MED ORDER — SODIUM CHLORIDE 0.9% FLUSH
3.0000 mL | Freq: Two times a day (BID) | INTRAVENOUS | Status: DC
  Administered 2024-05-16 – 2024-05-18 (×5): 3 mL via INTRAVENOUS

## 2024-05-16 MED ORDER — MIDAZOLAM HCL 2 MG/2ML IJ SOLN
INTRAMUSCULAR | Status: AC
  Filled 2024-05-16: qty 2

## 2024-05-16 MED ORDER — ACETAMINOPHEN 325 MG PO TABS: 650.0000 mg | ORAL_TABLET | ORAL | Status: DC | PRN

## 2024-05-16 MED ORDER — PROPOFOL 10 MG/ML IV BOLUS
INTRAVENOUS | Status: AC
  Filled 2024-05-16: qty 20

## 2024-05-16 MED ORDER — ACETAMINOPHEN 500 MG PO TABS
1000.0000 mg | ORAL_TABLET | Freq: Four times a day (QID) | ORAL | Status: DC
  Administered 2024-05-16 – 2024-05-18 (×8): 1000 mg via ORAL
  Filled 2024-05-16 (×11): qty 2

## 2024-05-16 MED ORDER — BUPIVACAINE-EPINEPHRINE (PF) 0.5% -1:200000 IJ SOLN
INTRAMUSCULAR | Status: DC | PRN
Start: 2024-05-16 — End: 2024-05-16
  Administered 2024-05-16: 7 mL

## 2024-05-16 MED ORDER — OXYCODONE HCL 5 MG PO TABS
10.0000 mg | ORAL_TABLET | ORAL | Status: DC | PRN
  Administered 2024-05-16 (×2): 10 mg via ORAL
  Filled 2024-05-16 (×3): qty 2

## 2024-05-16 MED ORDER — PROPOFOL 500 MG/50ML IV EMUL
INTRAVENOUS | Status: DC | PRN
  Administered 2024-05-16 (×2): 150 ug/kg/min via INTRAVENOUS

## 2024-05-16 MED ORDER — PROPOFOL 1000 MG/100ML IV EMUL
INTRAVENOUS | Status: AC
  Filled 2024-05-16: qty 100

## 2024-05-16 MED ORDER — HYDROMORPHONE HCL 1 MG/ML IJ SOLN
INTRAMUSCULAR | Status: DC | PRN
  Administered 2024-05-16: 1 mg via INTRAVENOUS

## 2024-05-16 MED ORDER — ACETAMINOPHEN 10 MG/ML IV SOLN
INTRAVENOUS | Status: DC | PRN
Start: 2024-05-16 — End: 2024-05-16
  Administered 2024-05-16: 1000 mg via INTRAVENOUS

## 2024-05-16 MED ORDER — ROSUVASTATIN CALCIUM 10 MG PO TABS
10.0000 mg | ORAL_TABLET | Freq: Every day | ORAL | Status: DC
  Filled 2024-05-16: qty 1

## 2024-05-16 MED ORDER — PROPOFOL 10 MG/ML IV BOLUS
INTRAVENOUS | Status: DC | PRN
  Administered 2024-05-16: 200 mg via INTRAVENOUS

## 2024-05-16 MED ORDER — LORATADINE 10 MG PO TABS
10.0000 mg | ORAL_TABLET | Freq: Every day | ORAL | Status: DC
  Administered 2024-05-16 – 2024-05-17 (×2): 10 mg via ORAL
  Filled 2024-05-16 (×2): qty 1

## 2024-05-16 MED ORDER — METHOCARBAMOL 1000 MG/10ML IJ SOLN: 500.0000 mg | Freq: Four times a day (QID) | INTRAMUSCULAR | Status: DC | PRN

## 2024-05-16 MED ORDER — REMIFENTANIL HCL 1 MG IV SOLR
INTRAVENOUS | Status: DC | PRN
  Administered 2024-05-16: .2 ug/kg/min via INTRAVENOUS

## 2024-05-16 MED ORDER — METHOCARBAMOL 500 MG PO TABS
500.0000 mg | ORAL_TABLET | Freq: Four times a day (QID) | ORAL | Status: DC | PRN
  Administered 2024-05-16 – 2024-05-18 (×7): 500 mg via ORAL
  Filled 2024-05-16 (×8): qty 1

## 2024-05-16 MED ORDER — LACTATED RINGERS IV SOLN: INTRAVENOUS | Status: DC

## 2024-05-16 MED ORDER — SODIUM CHLORIDE 0.9 % IV SOLN
12.5000 mg | Freq: Four times a day (QID) | INTRAVENOUS | Status: DC | PRN
  Administered 2024-05-16 – 2024-05-17 (×3): 12.5 mg via INTRAVENOUS
  Filled 2024-05-16 (×2): qty 12.5
  Filled 2024-05-16: qty 0.5

## 2024-05-16 MED ORDER — DEXAMETHASONE SODIUM PHOSPHATE 10 MG/ML IJ SOLN
INTRAMUSCULAR | Status: AC
  Filled 2024-05-16: qty 1

## 2024-05-16 MED ORDER — CEFAZOLIN SODIUM-DEXTROSE 2-4 GM/100ML-% IV SOLN
INTRAVENOUS | Status: AC
Start: 2024-05-16 — End: 2024-05-16
  Filled 2024-05-16: qty 100

## 2024-05-16 MED ORDER — MAGNESIUM CITRATE PO SOLN: 1.0000 | Freq: Once | ORAL | Status: DC | PRN

## 2024-05-16 MED ORDER — DOCUSATE SODIUM 100 MG PO CAPS
100.0000 mg | ORAL_CAPSULE | Freq: Two times a day (BID) | ORAL | Status: DC
  Administered 2024-05-17 (×2): 100 mg via ORAL
  Filled 2024-05-16 (×3): qty 1

## 2024-05-16 MED ORDER — ENOXAPARIN SODIUM 40 MG/0.4ML IJ SOSY
40.0000 mg | PREFILLED_SYRINGE | INTRAMUSCULAR | Status: DC
  Administered 2024-05-17 – 2024-05-18 (×2): 40 mg via SUBCUTANEOUS
  Filled 2024-05-16 (×2): qty 0.4

## 2024-05-16 MED ORDER — DEXMEDETOMIDINE HCL IN NACL 80 MCG/20ML IV SOLN
INTRAVENOUS | Status: DC | PRN
  Administered 2024-05-16: 8 ug via INTRAVENOUS

## 2024-05-16 MED ORDER — ROSUVASTATIN CALCIUM 10 MG PO TABS
10.0000 mg | ORAL_TABLET | Freq: Every day | ORAL | Status: DC
  Administered 2024-05-16 – 2024-05-17 (×2): 10 mg via ORAL
  Filled 2024-05-16 (×2): qty 1

## 2024-05-16 MED ORDER — ONDANSETRON HCL 4 MG/2ML IJ SOLN
INTRAMUSCULAR | Status: DC | PRN
  Administered 2024-05-16: 4 mg via INTRAVENOUS

## 2024-05-16 MED ORDER — FIBRIN SEALANT 2 ML SINGLE DOSE KIT
PACK | CUTANEOUS | Status: DC | PRN
  Administered 2024-05-16: 2 mL via TOPICAL

## 2024-05-16 MED ORDER — LIDOCAINE HCL (PF) 2 % IJ SOLN
INTRAMUSCULAR | Status: DC | PRN
  Administered 2024-05-16: 100 mg via INTRADERMAL

## 2024-05-16 MED ORDER — HYDROMORPHONE HCL 1 MG/ML IJ SOLN
INTRAMUSCULAR | Status: AC
Start: 2024-05-16 — End: 2024-05-16
  Filled 2024-05-16: qty 1

## 2024-05-16 MED ORDER — SORBITOL 70 % SOLN: 30.0000 mL | Freq: Every day | Status: DC | PRN

## 2024-05-16 MED ORDER — ACETAMINOPHEN 10 MG/ML IV SOLN: 1000.0000 mg | Freq: Once | INTRAVENOUS | Status: DC | PRN

## 2024-05-16 MED ORDER — PANTOPRAZOLE SODIUM 40 MG PO TBEC
40.0000 mg | DELAYED_RELEASE_TABLET | Freq: Every day | ORAL | Status: DC
Start: 2024-05-17 — End: 2024-05-16

## 2024-05-16 MED ORDER — OXYCODONE HCL 5 MG PO TABS: 5.0000 mg | ORAL_TABLET | Freq: Once | ORAL | Status: DC | PRN

## 2024-05-16 MED ORDER — SENNA 8.6 MG PO TABS
1.0000 | ORAL_TABLET | Freq: Two times a day (BID) | ORAL | Status: DC
  Administered 2024-05-17 (×2): 8.6 mg via ORAL
  Filled 2024-05-16 (×3): qty 1

## 2024-05-16 MED ORDER — OXYCODONE HCL 5 MG/5ML PO SOLN: 5.0000 mg | Freq: Once | ORAL | Status: DC | PRN

## 2024-05-16 MED ORDER — 0.9 % SODIUM CHLORIDE (POUR BTL) OPTIME
TOPICAL | Status: DC | PRN
  Administered 2024-05-16: 500 mL

## 2024-05-16 MED ORDER — BUPIVACAINE-EPINEPHRINE (PF) 0.5% -1:200000 IJ SOLN
INTRAMUSCULAR | Status: AC
  Filled 2024-05-16: qty 10

## 2024-05-16 MED ORDER — ACETAMINOPHEN 10 MG/ML IV SOLN
INTRAVENOUS | Status: AC
  Filled 2024-05-16: qty 100

## 2024-05-16 MED ORDER — PANTOPRAZOLE SODIUM 40 MG PO TBEC
40.0000 mg | DELAYED_RELEASE_TABLET | Freq: Every day | ORAL | Status: DC
  Administered 2024-05-16 – 2024-05-17 (×2): 40 mg via ORAL
  Filled 2024-05-16 (×2): qty 1

## 2024-05-16 MED ORDER — SUCCINYLCHOLINE CHLORIDE 200 MG/10ML IV SOSY
PREFILLED_SYRINGE | INTRAVENOUS | Status: DC | PRN
  Administered 2024-05-16: 100 mg via INTRAVENOUS

## 2024-05-16 MED ORDER — LIDOCAINE HCL (PF) 2 % IJ SOLN
INTRAMUSCULAR | Status: AC
  Filled 2024-05-16: qty 5

## 2024-05-16 NOTE — Anesthesia Preprocedure Evaluation (Addendum)
 Anesthesia Evaluation  Patient identified by MRN, date of birth, ID band Patient awake    Reviewed: Allergy & Precautions, NPO status , Patient's Chart, lab work & pertinent test results  History of Anesthesia Complications Negative for: history of anesthetic complications  Airway Mallampati: II   Neck ROM: Full    Dental no notable dental hx.    Pulmonary sleep apnea    Pulmonary exam normal breath sounds clear to auscultation       Cardiovascular Normal cardiovascular exam Rhythm:Regular Rate:Normal     Neuro/Psych  PSYCHIATRIC DISORDERS Anxiety Depression    negative neurological ROS     GI/Hepatic ,GERD  ,,  Endo/Other  Obesity   Renal/GU Renal disease (nephrolithiasis)     Musculoskeletal  (+) Arthritis , Rheumatoid disorders,    Abdominal   Peds  Hematology negative hematology ROS (+)   Anesthesia Other Findings   Reproductive/Obstetrics                             Anesthesia Physical Anesthesia Plan  ASA: 2  Anesthesia Plan: General   Post-op Pain Management:    Induction: Intravenous  PONV Risk Score and Plan: 3 and Ondansetron, Dexamethasone and Treatment may vary due to age or medical condition  Airway Management Planned: Oral ETT  Additional Equipment:   Intra-op Plan:   Post-operative Plan: Extubation in OR  Informed Consent: I have reviewed the patients History and Physical, chart, labs and discussed the procedure including the risks, benefits and alternatives for the proposed anesthesia with the patient or authorized representative who has indicated his/her understanding and acceptance.     Dental advisory given  Plan Discussed with: CRNA  Anesthesia Plan Comments: (Patient consented for risks of anesthesia including but not limited to:  - adverse reactions to medications - damage to eyes, teeth, lips or other oral mucosa - nerve damage due to  positioning  - sore throat or hoarseness - damage to heart, brain, nerves, lungs, other parts of body or loss of life  Informed patient about role of CRNA in peri- and intra-operative care.  Patient voiced understanding.)       Anesthesia Quick Evaluation

## 2024-05-16 NOTE — Addendum Note (Signed)
 Addendum  created 05/16/24 1216 by Myra Lawless, CRNA   Flowsheet accepted

## 2024-05-16 NOTE — Addendum Note (Signed)
 Addendum  created 05/16/24 1249 by Myra Lawless, CRNA   Flowsheet accepted

## 2024-05-16 NOTE — Interval H&P Note (Signed)
 History and Physical Interval Note:  05/16/2024 6:56 AM  Carolyn Hays  has presented today for surgery, with the diagnosis of M54.12 cervical radiculopathy M48.02 Cervical stenosis of spinal canal.  The various methods of treatment have been discussed with the patient and family. After consideration of risks, benefits and other options for treatment, the patient has consented to  Procedure(s) with comments: CERVICAL ANTERIOR DISC ARTHROPLASTY, 2 LEVEL (N/A) - C4-6 ARTHROPLASTY as a surgical intervention.  The patient's history has been reviewed, patient examined, no change in status, stable for surgery.  I have reviewed the patient's chart and labs.  Questions were answered to the patient's satisfaction.    Heart sounds normal no MRG. Chest Clear to Auscultation Bilaterally.   Laikyn Gewirtz

## 2024-05-16 NOTE — Op Note (Signed)
 Indications: [name] is a 53 y.o. female with M54.12 cervical radiculopathy, M48.02 Cervical stenosis of spinal canal   Findings: calcification of C4/5 disc, adherent to dura  Preoperative Diagnosis: M54.12 cervical radiculopathy, M48.02 Cervical stenosis of spinal canal  Postoperative Diagnosis: same   EBL: 50 ml IVF: see AR Drains: lumbar drain Disposition: Extubated and Stable to PACU Complications: none  No foley catheter was placed.   Preoperative Note:   Risks of surgery discussed include: infection, bleeding, stroke, coma, death, paralysis, CSF leak, nerve/spinal cord injury, numbness, tingling, weakness, complex regional pain syndrome, recurrent stenosis and/or disc herniation, vascular injury, development of instability, neck/back pain, need for further surgery, persistent symptoms, development of deformity, and the risks of anesthesia. The patient understood these risks and agreed to proceed.  Operative Note:   Operative Note:  Procedure:  1) Cervical Disc Arthroplasty at C4/5 and C5/6 using a LDR Mobi-C device 2) Lumbar drain placement   Procedure: After obtaining informed consent, the patient taken to the operating room, placed in supine position, general anesthesia induced.  The patient had a small shoulder roll placed behind the neck.  The patient received preop antibiotics and IV Decadron.  The patient had a neck incision outlined, was prepped and draped in usual sterile fashion. The incision was injected with local anesthetic.   An incision was opened, dissection taken down medial to the carotid artery and jugular vein, lateral to the trachea and esophagus.  The prevertebral fascia identified and a localizing x-ray demonstrated the correct level.  The longus colli were dissected laterally, and self-retaining retractors placed to open the operative field. The microscope was then brought into the field.  With this complete, distractor pins were placed in the  vertebral bodies of C4 and C5. The distractor was placed, then the annulus at C4/5 was opened using a bovie.  Curettes and pituitary rongeurs used to remove the majority of disk, then the drill was used to remove the posterior osteophyte and begin the foraminotomies. The nerve hook was used to elevate the posterior longitudinal ligament, which was then removed with Kerrison rongeurs. The microblunt nerve hook could be passed out the foramen bilateral at each level.   Meticulous hemostasis was obtained.  A trial spacer was used to size the disc space and make the keel cut. Using flouroscopic guidance, a medium 4 mm height Nuvasive Simplify was then inserted in the prepared disc space.  During removal of the posterior longitudinal ligament, there was some calcification and an area of significantly adherent PLL to the dura.  During removal, a small durotomy was made with a high flow leak.  Vistaseal was used to try to lessen the amount of leakage.   The C4 pin was moved to C6. The distractor was then placed at C5/6.  Then, the annulus at C5/6 was opened using a bovie.  Curettes and pituitary rongeurs used to remove the majority of disk, then the drill was used to remove the posterior osteophyte and begin the foraminotomies. The nerve hook was used to elevate the posterior longitudinal ligament, which was then removed with Kerrison rongeurs. The microblunt nerve hook could be passed out the foramen bilateral at each level.   Meticulous hemostasis was obtained.  A trial spacer was used to size the disc space. Using flouroscopic guidance, a medium 4 mm height Nuvasive Simplify was then inserted in the prepared disc space.  The caspar distractor was removed, and bone wax used for hemostasis. Final AP and lateral radiographs were  taken.   With the disc arthroplasties in good position, the wound was irrigated copiously and meticulous hemostasis obtained.  Additional leakage was noted from the C4/5 disc space,  thought to be CSF.  Due to this, intraoperative decision was made to place lumbar drain after closure.  Wound was closed in 2 layers using interrupted inverted 3-0 Vicryl sutures.  Monocryl was then placed on the skin. The wound was dressed with dermabond.  The drapes were removed and the patient was repositioned into the left lateral decubitus position.  Her lumbar spine was palpated.  It was then prepped using chlorhexidine.  A 14-gauge Touhy needle was used to access the spinal canal at approximately the L3-4 level.  Brisk flow of blood-tinged spinal fluid was noted.  The intrathecal catheter was then threaded into position approximately 15 cm at the skin.  The Touhy needle was removed.  The drain was then secured to the connector which was connected to the collection chamber.  A 3-0 Monocryl suture was placed for closure after removal of the drain.  It was secured to the drainage tube using the Steri-Strip.  The drapes were then removed and a sterile dressing applied.  10 mL of spinal fluid was drained into the chamber to confirm that it was working appropriately.   We then raised the head of bed to 30 degrees, and the patient was taken to recovery room in stable condition.  No new postop neurological deficits were identified.  Sponge and pattie counts were correct at the end of the procedure.   I performed the entire procedure with the assistance of Edsel Goods PA as an Designer, television/film set. An assistant was required for this procedure due to the complexity.  The assistant provided assistance in tissue manipulation and suction, and was required for the successful and safe performance of the procedure. I performed the critical portions of the procedure.   Reeves Daisy MD

## 2024-05-16 NOTE — Anesthesia Postprocedure Evaluation (Signed)
 Anesthesia Post Note  Patient: Carolyn Hays  Procedure(s) Performed: CERVICAL ANTERIOR DISC ARTHROPLASTY, 2 LEVEL (Spine Cervical) PLACEMENT OF LUMBAR DRAIN (Back)  Patient location during evaluation: PACU Anesthesia Type: General Level of consciousness: awake and alert, oriented and patient cooperative Pain management: pain level controlled Vital Signs Assessment: post-procedure vital signs reviewed and stable Respiratory status: spontaneous breathing, nonlabored ventilation and respiratory function stable Cardiovascular status: blood pressure returned to baseline and stable Postop Assessment: adequate PO intake Anesthetic complications: no   No notable events documented.   Last Vitals:  Vitals:   05/16/24 1030 05/16/24 1036  BP: (!) 164/100 (!) 153/91  Pulse: 95 83  Resp: 11 15  Temp: (!) 36.3 C   SpO2: 99% 97%    Last Pain:  Vitals:   05/16/24 1053  PainSc: 5                  Alfonso Ruths

## 2024-05-16 NOTE — Plan of Care (Signed)
  Problem: Education: Goal: Knowledge of General Education information will improve Description: Including pain rating scale, medication(s)/side effects and non-pharmacologic comfort measures Outcome: Progressing   Problem: Activity: Goal: Risk for activity intolerance will decrease Outcome: Progressing   Problem: Nutrition: Goal: Adequate nutrition will be maintained Outcome: Progressing   Problem: Coping: Goal: Level of anxiety will decrease Outcome: Progressing   Problem: Clinical Measurements: Goal: Respiratory complications will improve Outcome: Not Applicable Goal: Cardiovascular complication will be avoided Outcome: Not Applicable

## 2024-05-16 NOTE — TOC Initial Note (Signed)
 Transition of Care [facility]) - Initial/Assessment Note    Patient Details  Name: Carolyn Hays MRN: [id number] Date of Birth: [birth year]  Transition of Care [facility]) CM/SW Contact:    Quintella Suzen Jansky, RN Phone Number: 05/16/2024, 3:52 PM  Clinical Narrative:                  Patient lives with spouse. Independent with ADLs. Pending PT/OT evaluation. TOC will monitor for discharge needs.       Patient Goals and CMS Choice            Expected Discharge Plan and Services                                              Prior Living Arrangements/Services                       Activities of Daily Living   ADL Screening (condition at time of admission) Independently performs ADLs?: Yes (appropriate for developmental age) Is the patient deaf or have difficulty hearing?: No Does the patient have difficulty seeing, even when wearing glasses/contacts?: No Does the patient have difficulty concentrating, remembering, or making decisions?: No  Permission Sought/Granted                  Emotional Assessment              Admission diagnosis:  Cervical radiculopathy [M54.12] Cervical stenosis of spinal canal [M48.02] CSF leak [G96.00] Patient Active Problem List   Diagnosis Date Noted   Spinal stenosis in cervical region 05/16/2024   CSF leak 05/16/2024   Cervical radiculopathy 05/16/2024   Left cervical radiculopathy 11/30/2023   Situational anxiety 05/27/2023   Otalgia of right ear 12/17/2022   Sleep apnea in adult 11/17/2022   Elevated blood pressure reading 11/17/2022   Loud snoring 10/11/2022   Excessive daytime sleepiness 10/11/2022   Annual physical exam 10/09/2022   Excess body and facial hair 07/03/2022   Greater trochanteric pain syndrome 11/04/2019   Hypercholesterolemia 11/04/2019   Insomnia 11/04/2019   Varicose veins of lower extremity 11/04/2019   Decreased libido 08/21/2017   Alopecia 09/11/2015   Carpal tunnel syndrome  09/11/2015   Rheumatoid arthritis (HCC) 09/11/2015   Dermatitis, eczematoid 09/11/2015   Acid reflux 09/11/2015   Anxiety, generalized 09/11/2015   B12 neuropathy (HCC) 09/11/2015   Avitaminosis D 09/11/2015   Disorder of joint of spine 03/01/2014   Inflammatory spondylopathy (HCC) 03/01/2014   Allergic rhinitis 09/11/2009   Cervicogenic headache 09/11/2009   Irritable bowel syndrome 09/11/2009   PCP:  Sharma Coyer, MD Pharmacy:   [facility] [phone] [place], [place] - [address] AT [facility] [address] [place] [address] [place] [address] Phone: [phone] Fax: [phone]     Social Drivers of Health (SDOH) Social History: SDOH Screenings   Food Insecurity: No Food Insecurity (05/16/2024)  Housing: Low Risk  (05/16/2024)  Transportation Needs: No Transportation Needs (05/16/2024)  Utilities: Not At Risk (05/16/2024)  Alcohol Screen: Low Risk  (05/26/2023)  Depression (PHQ2-9): Low Risk  (05/27/2023)  Financial Resource Strain: Low Risk  (02/25/2024)   Received from [facility]  Physical Activity: Sufficiently Active (05/26/2023)  Social Connections: Moderately Integrated (05/26/2023)  Stress: No Stress Concern Present (05/26/2023)  Tobacco Use: Low Risk  (05/16/2024)  SDOH Interventions:     Readmission Risk Interventions     No data to display

## 2024-05-16 NOTE — Transfer of Care (Signed)
 Immediate Anesthesia Transfer of Care Note  Patient: Carolyn Hays  Procedure(s) Performed: CERVICAL ANTERIOR DISC ARTHROPLASTY, 2 LEVEL (Spine Cervical) PLACEMENT OF LUMBAR DRAIN (Back)  Patient Location: PACU  Anesthesia Type:General  Level of Consciousness: awake, alert , oriented, and patient cooperative  Airway & Oxygen Therapy: Patient Spontanous Breathing and Patient connected to face mask oxygen  Post-op Assessment: Report given to RN, Post -op Vital signs reviewed and stable, and Patient moving all extremities X 4  Post vital signs: Reviewed and stable  Last Vitals:  Vitals Value Taken Time  BP 164/102 05/16/24 10:20  Temp 36.7 C 05/16/24 10:20  Pulse 91 05/16/24 10:20  Resp 11 05/16/24 10:20  SpO2 100 % 05/16/24 10:20  Vitals shown include unfiled device data.  Last Pain:  Vitals:   05/16/24 0617  PainSc: 0-No pain         Complications: No notable events documented.

## 2024-05-16 NOTE — Progress Notes (Signed)
 Patient report given to Izetta, RN in ICU. Drowsy, but oriented x4. Grips moderate, bilateral. No numbness or tingling noted in extremities.

## 2024-05-17 MED ORDER — CEFAZOLIN SODIUM-DEXTROSE 1-4 GM/50ML-% IV SOLN
1.0000 g | Freq: Three times a day (TID) | INTRAVENOUS | Status: AC
  Administered 2024-05-17 – 2024-05-18 (×3): 1 g via INTRAVENOUS
  Filled 2024-05-17 (×4): qty 50

## 2024-05-17 MED ORDER — LORATADINE 10 MG PO TABS
10.0000 mg | ORAL_TABLET | Freq: Once | ORAL | Status: AC
  Administered 2024-05-17: 10 mg via ORAL
  Filled 2024-05-17: qty 1

## 2024-05-17 MED ORDER — BUTALBITAL-APAP-CAFFEINE 50-325-40 MG PO TABS
1.0000 | ORAL_TABLET | ORAL | Status: DC | PRN
  Administered 2024-05-17: 1 via ORAL
  Filled 2024-05-17: qty 1

## 2024-05-17 MED ORDER — PANTOPRAZOLE SODIUM 40 MG PO TBEC
40.0000 mg | DELAYED_RELEASE_TABLET | Freq: Once | ORAL | Status: AC
  Administered 2024-05-17: 40 mg via ORAL
  Filled 2024-05-17: qty 1

## 2024-05-17 NOTE — Evaluation (Signed)
 Physical Therapy Evaluation Patient Details Name: Carolyn Hays MRN: [id number] DOB: March 07, 1971 Today's Date: 05/17/2024  History of Present Illness  Patient is a 53 year old female with severe cervical stenosis s/p C4-6 arthroplasty complicated by CSF leak requiring lumbar drain.  Clinical Impression  Patient is agreeable to PT evaluation. She complains of nausea that has improved since getting medication earlier. She is independent at baseline and lives with spouse.  Today the patient is moving well. She ambulated into hallway with slow but steady gait without loss of balance with no assistive device required. Anticipate patient will have increased activity tolerance when nausea improves. Recommend PT follow up while in the hospital to maximize independence and to decrease caregiver burden.       If plan is discharge home, recommend the following: Assist for transportation;Assistance with cooking/housework   Can travel by private vehicle        Equipment Recommendations None recommended by PT  Recommendations for Other Services       Functional Status Assessment Patient has had a recent decline in their functional status and demonstrates the ability to make significant improvements in function in a reasonable and predictable amount of time.     Precautions / Restrictions Precautions Precautions: Fall (low fall risk) Recall of Precautions/Restrictions: Intact Precaution/Restrictions Comments: lumbar drain must be clamped for mobility Restrictions Weight Bearing Restrictions Per Provider Order: No      Mobility  Bed Mobility Overal bed mobility: Needs Assistance Bed Mobility: Rolling, Sidelying to Sit, Sit to Sidelying Rolling: Supervision Sidelying to sit: Supervision     Sit to sidelying: Supervision      Transfers Overall transfer level: Needs assistance Equipment used: None Transfers: Sit to/from Stand Sit to Stand: Supervision                 Ambulation/Gait Ambulation/Gait assistance: Supervision, Contact guard assist Gait Distance (Feet): 75 Feet Assistive device: None Gait Pattern/deviations: Step-through pattern Gait velocity: decreased     General Gait Details: slow but steady without assistive device, no loss of balance. mild nausea reported with mobility that has improved after medication  Stairs            Wheelchair Mobility     Tilt Bed    Modified Rankin (Stroke Patients Only)       Balance Overall balance assessment: Needs assistance Sitting-balance support: Feet supported Sitting balance-Leahy Scale: Good     Standing balance support: No upper extremity supported, During functional activity Standing balance-Leahy Scale: Fair Standing balance comment: no loss of balance while standing for several minutes as sink for brushing teeth                             Pertinent Vitals/Pain Pain Assessment Pain Assessment: Faces Pain Score: 4  Pain Location: neck and headache Pain Descriptors / Indicators: Discomfort Pain Intervention(s): Limited activity within patient's tolerance, Monitored during session, Repositioned    Home Living Family/patient expects to be discharged to:: Private residence Living Arrangements: Spouse/significant other;Children Available Help at Discharge: Family Type of Home: House Home Access: Level entry       Home Layout: Able to live on main level with bedroom/bathroom Home Equipment: None      Prior Function Prior Level of Function : Independent/Modified Independent;Working/employed               ADLs Comments: IND in all ADLs, virtual school teacher     Extremity/Trunk Assessment  Upper Extremity Assessment Upper Extremity Assessment: Defer to OT evaluation    Lower Extremity Assessment Lower Extremity Assessment: Overall WFL for tasks assessed;LLE deficits/detail;RLE deficits/detail RLE Deficits / Details: 5/5 dorsiflexion,  plantarflexion. no knee buckling with ambulation RLE Sensation: WNL LLE Deficits / Details: 5/5 dorsiflexion, plantarflexion. no knee buckling with ambulation LLE Sensation: WNL       Communication   Communication Communication: No apparent difficulties    Cognition Arousal: Alert Behavior During Therapy: WFL for tasks assessed/performed   PT - Cognitive impairments: No apparent impairments                         Following commands: Intact       Cueing Cueing Techniques: Verbal cues     General Comments      Exercises     Assessment/Plan    PT Assessment Patient needs continued PT services  PT Problem List Decreased strength;Decreased range of motion;Decreased activity tolerance;Decreased mobility;Decreased safety awareness       PT Treatment Interventions DME instruction;Gait training;Stair training;Functional mobility training;Therapeutic activities;Therapeutic exercise;Balance training;Neuromuscular re-education;Patient/family education    PT Goals (Current goals can be found in the Care Plan section)  Acute Rehab PT Goals Patient Stated Goal: to go home PT Goal Formulation: With patient Time For Goal Achievement: 05/31/24 Potential to Achieve Goals: Good    Frequency Min 2X/week     Co-evaluation PT/OT/SLP Co-Evaluation/Treatment: Yes Reason for Co-Treatment: Complexity of the patient's impairments (multi-system involvement)   OT goals addressed during session: ADL's and self-care       AM-PAC PT 6 Clicks Mobility  Outcome Measure Help needed turning from your back to your side while in a flat bed without using bedrails?: None Help needed moving from lying on your back to sitting on the side of a flat bed without using bedrails?: A Little Help needed moving to and from a bed to a chair (including a wheelchair)?: A Little Help needed standing up from a chair using your arms (e.g., wheelchair or bedside chair)?: A Little Help needed to  walk in hospital room?: A Little Help needed climbing 3-5 steps with a railing? : A Little 6 Click Score: 19    End of Session   Activity Tolerance: Patient tolerated treatment well Patient left: in bed;with call bell/phone within reach;with bed alarm set Nurse Communication: Mobility status PT Visit Diagnosis: Unsteadiness on feet (R26.81);Other abnormalities of gait and mobility (R26.89)    Time: 8899-8878 PT Time Calculation (min) (ACUTE ONLY): 21 min   Charges:   PT Evaluation $PT Eval Moderate Complexity: 1 Mod   PT General Charges $$ ACUTE PT VISIT: 1 Visit         Randine Essex, PT, MPT   Randine LULLA Essex 05/17/2024, 11:49 AM

## 2024-05-17 NOTE — Progress Notes (Signed)
 Occupational Therapy Evaluation Patient Details Name: Carolyn Hays MRN: [id number] DOB: 24-Oct-1971 Today's Date: 05/17/2024   History of Present Illness   Patient is a 53 year old female with severe cervical stenosis s/p C4-6 arthroplasty complicated by CSF leak requiring lumbar drain.     Clinical Impressions Carolyn Hays was seen for OT evaluation this date. Prior to hospital admission, pt was IND in ADLs. Pt lives w/ spouse and children. Pt presents to acute OT demonstrating impaired ADL performance and functional mobility 2/2 BUE pain and numbness (See OT problem list for additional functional deficits). Pt was 5/5 for MMT of BUE. Pt currently requires SUPERVISION for bed mobility and standing grooming at sink. Pt reported nausea and headache prior to mobility, denied worsening symptoms after mobility. Pt ambulated ~80 ft w/o AD use. Pt educated in functional application of cervical precautions and home/routines modifications.   Will maintain on caseload to progress standing ADL tolerance and functional mobility. Do not anticipate the need for follow up OT services upon acute hospital DC.      If plan is discharge home, recommend the following:   A little help with walking and/or transfers;A little help with bathing/dressing/bathroom;Assistance with cooking/housework;Assist for transportation     Functional Status Assessment   Patient has had a recent decline in their functional status and demonstrates the ability to make significant improvements in function in a reasonable and predictable amount of time.     Equipment Recommendations   BSC/3in1     Recommendations for Other Services         Precautions/Restrictions   Precautions Precautions: None Restrictions Weight Bearing Restrictions Per Provider Order: No     Mobility Bed Mobility Overal bed mobility: Needs Assistance Bed Mobility: Supine to Sit     Supine to sit: Supervision           Transfers Overall transfer level: Needs assistance Equipment used: None Transfers: Sit to/from Stand Sit to Stand: Supervision, +2 safety/equipment                  Balance Overall balance assessment: Needs assistance Sitting-balance support: Feet unsupported, No upper extremity supported Sitting balance-Leahy Scale: Good     Standing balance support: No upper extremity supported, During functional activity Standing balance-Leahy Scale: Good                             ADL either performed or assessed with clinical judgement   ADL Overall ADL's : Needs assistance/impaired                                       General ADL Comments: SUPERVISION for standing grooming at sink, MAX A for compression hose management     Vision         Perception         Praxis         Pertinent Vitals/Pain Pain Assessment Pain Assessment: 0-10 Pain Score: 4  Pain Location: head/shoulders Pain Descriptors / Indicators: Discomfort, Headache, Aching Pain Intervention(s): Limited activity within patient's tolerance, Monitored during session, Repositioned     Extremity/Trunk Assessment Upper Extremity Assessment Upper Extremity Assessment: Overall WFL for tasks assessed   Lower Extremity Assessment Lower Extremity Assessment: Defer to PT evaluation       Communication Communication Communication: No apparent difficulties   Cognition Arousal: Alert Behavior During Therapy:  WFL for tasks assessed/performed Cognition: No apparent impairments                               Following commands: Intact       Cueing  General Comments   Cueing Techniques: Verbal cues  MMT 5/5 for BUE shoulder abduction, elbow flexion/extension, and grip strength   Exercises     Shoulder Instructions      Home Living Family/patient expects to be discharged to:: Private residence Living Arrangements: Spouse/significant  other;Children Available Help at Discharge: Family Type of Home: House Home Access: Level entry     Home Layout: Able to live on main level with bedroom/bathroom     Bathroom Shower/Tub: Chief Strategy Officer: Handicapped height     Home Equipment: None          Prior Functioning/Environment Prior Level of Function : Independent/Modified Independent;Working/employed               ADLs Comments: IND in all ADLs, virtual school teacher    OT Problem List: Decreased activity tolerance;Impaired balance (sitting and/or standing)   OT Treatment/Interventions: Self-care/ADL training;Patient/family education      OT Goals(Current goals can be found in the care plan section)   Acute Rehab OT Goals Patient Stated Goal: to go home OT Goal Formulation: With patient Time For Goal Achievement: 05/31/24 Potential to Achieve Goals: Good ADL Goals Pt Will Perform Upper Body Dressing: Independently;standing Pt Will Transfer to Toilet: Independently;ambulating;regular height toilet Pt Will Perform Tub/Shower Transfer: Tub transfer;Independently;ambulating   OT Frequency:  Min 2X/week    Co-evaluation PT/OT/SLP Co-Evaluation/Treatment: Yes Reason for Co-Treatment: Complexity of the patient's impairments (multi-system involvement) PT goals addressed during session: Mobility/safety with mobility OT goals addressed during session: ADL's and self-care      AM-PAC OT 6 Clicks Daily Activity     Outcome Measure Help from another person eating meals?: None Help from another person taking care of personal grooming?: None Help from another person toileting, which includes using toliet, bedpan, or urinal?: A Little Help from another person bathing (including washing, rinsing, drying)?: A Lot Help from another person to put on and taking off regular upper body clothing?: A Little Help from another person to put on and taking off regular lower body clothing?: A  Little 6 Click Score: 19   End of Session    Activity Tolerance: Patient tolerated treatment well Patient left: in bed;with call bell/phone within reach;Other (comment) (w/ PA in room assessing lumbar drain)  OT Visit Diagnosis: Other symptoms and signs involving the nervous system (M70.101)                Time: 8941-8873 OT Time Calculation (min): 28 min Charges:  OT General Charges $OT Visit: 1 Visit OT Evaluation $OT Eval Moderate Complexity: 1 Mod OT Treatments $Self Care/Home Management : 8-22 mins  [place] Shro[place], Student OT   [employer] 05/17/2024, 12:44 PM

## 2024-05-17 NOTE — Discharge Instructions (Signed)

## 2024-05-17 NOTE — Progress Notes (Signed)
   Neurosurgery Progress Note  History: Carolyn Hays is s/p C4-6 arthroplasty and lumbar drain placement  POD1: Continued nausea when up overnight. Some posterior neck pain.   Physical Exam: Vitals:   05/17/24 0600 05/17/24 0700  BP: (!) 152/90 (!) 151/87  Pulse: 73 70  Resp:    Temp:    SpO2: 95% 96%    AA Ox3 CNI  Strength: Side Biceps Triceps Deltoid Interossei Grip Wrist Ext. Wrist Flex.  R 5 5 5 5 5 5 5   L 4+ 5 4+ 5 5 5 5     Side Iliopsoas Quads Hamstring PF DF EHL  R 5 5 5 5 5 5   L 5 5 5 5 5 5     Incision: c/d/I with dermabond in place. No significant fluid collection  Lumbar drain without complications. 200 out since surgery   Data:  Other tests/results: see results review  Assessment/Plan:  Carolyn Hays is a 53 y.o presenting with severe cervical stenosis s/p C4-6 arthroplasty complicated by CSF leak requiring lumbar drain   - mobilize - pain control - DVT prophylaxis - ok to work with PTOT while drain is clamped - will continue lumbar drain for now.   Edsel Goods PA-C Department of Neurosurgery

## 2024-05-18 ENCOUNTER — Encounter: Payer: Self-pay | Admitting: Neurosurgery

## 2024-05-18 ENCOUNTER — Other Ambulatory Visit: Payer: Self-pay

## 2024-05-18 MED ORDER — BUTALBITAL-APAP-CAFFEINE 50-325-40 MG PO TABS
1.0000 | ORAL_TABLET | ORAL | 0 refills | Status: AC | PRN
  Filled 2024-05-18: qty 14, 3d supply, fill #0

## 2024-05-18 MED ORDER — OXYCODONE HCL 5 MG PO TABS
5.0000 mg | ORAL_TABLET | ORAL | 0 refills | Status: DC | PRN
  Filled 2024-05-18: qty 30, 5d supply, fill #0

## 2024-05-18 MED ORDER — SENNA 8.6 MG PO TABS
1.0000 | ORAL_TABLET | Freq: Two times a day (BID) | ORAL | 0 refills | Status: DC
  Filled 2024-05-18: qty 120, 60d supply, fill #0

## 2024-05-18 MED ORDER — METHOCARBAMOL 500 MG PO TABS
500.0000 mg | ORAL_TABLET | Freq: Four times a day (QID) | ORAL | 0 refills | Status: DC | PRN
  Filled 2024-05-18: qty 60, 15d supply, fill #0

## 2024-05-18 NOTE — Progress Notes (Addendum)
 Physical Therapy Treatment Patient Details Name: Carolyn Hays MRN: [id number] DOB: 07-Jun-1971 Today's Date: 05/18/2024   History of Present Illness Patient is a 53 year old female with severe cervical stenosis s/p C4-6 arthroplasty complicated by CSF leak requiring lumbar drain.    PT Comments  Patient is agreeable to PT session. She reports feeling better overall compared to yesterday. Increased activity tolerance this session. Patient walked 2 laps in hallway without assistive device and no loss of balance. PT will continue to follow while in the hospital. No immediate PT needs anticipated after this hospital stay.    If plan is discharge home, recommend the following: Assist for transportation;Assistance with cooking/housework   Can travel by private vehicle        Equipment Recommendations  None recommended by PT    Recommendations for Other Services       Precautions / Restrictions Precautions Precautions: Fall Recall of Precautions/Restrictions: Intact Restrictions Weight Bearing Restrictions Per Provider Order: No     Mobility  Bed Mobility Overal bed mobility: Modified Independent                  Transfers Overall transfer level: Modified independent                      Ambulation/Gait Ambulation/Gait assistance: Modified independent (Device/Increase time) Gait Distance (Feet): 320 Feet Assistive device: None Gait Pattern/deviations: Step-through pattern Gait velocity: decreased     General Gait Details: slow but steady without loss of balance with ambulation. patient reports feeling mildly weak from not walking, with improved energy level with increased ambulation distance. encouraged routine walking for conditioning .   Stairs             Wheelchair Mobility     Tilt Bed    Modified Rankin (Stroke Patients Only)       Balance Overall balance assessment: Needs assistance Sitting-balance support: Feet supported Sitting  balance-Leahy Scale: Good     Standing balance support: No upper extremity supported, During functional activity Standing balance-Leahy Scale: Fair Standing balance comment: no loss of balance with standing for ADLs at sink                            Communication Communication Communication: No apparent difficulties  Cognition Arousal: Alert Behavior During Therapy: WFL for tasks assessed/performed   PT - Cognitive impairments: No apparent impairments                         Following commands: Intact      Cueing Cueing Techniques: Verbal cues  Exercises      General Comments        Pertinent Vitals/Pain Pain Assessment Pain Assessment: Faces Faces Pain Scale: Hurts a little bit Pain Location: neck    Home Living                          Prior Function            PT Goals (current goals can now be found in the care plan section) Acute Rehab PT Goals Patient Stated Goal: to go home PT Goal Formulation: With patient Time For Goal Achievement: 05/31/24 Potential to Achieve Goals: Good Progress towards PT goals: Progressing toward goals    Frequency    Min 2X/week      PT Plan  Co-evaluation              AM-PAC PT 6 Clicks Mobility   Outcome Measure  Help needed turning from your back to your side while in a flat bed without using bedrails?: None Help needed moving from lying on your back to sitting on the side of a flat bed without using bedrails?: None Help needed moving to and from a bed to a chair (including a wheelchair)?: None Help needed standing up from a chair using your arms (e.g., wheelchair or bedside chair)?: A Little Help needed to walk in hospital room?: A Little Help needed climbing 3-5 steps with a railing? : A Little 6 Click Score: 21    End of Session   Activity Tolerance: Patient tolerated treatment well Patient left: in bed;with call bell/phone within reach (seated on edge of bed  with spouse present) Nurse Communication: Mobility status       Time: 9043-8979 PT Time Calculation (min) (ACUTE ONLY): 24 min  Charges:    $Therapeutic Activity: 23-37 mins PT General Charges $$ ACUTE PT VISIT: 1 Visit                     Carolyn Hays, PT, MPT    Carolyn Hays 05/18/2024, 12:56 PM

## 2024-05-18 NOTE — Progress Notes (Signed)
 Patient ambulated with PT/OT. Discharge instructions reviewed with patient and husband. Volunteer called to take patient in wheelchair to entrance. Patient leaving by private vehicle.

## 2024-05-18 NOTE — Discharge Summary (Signed)
 Physician Discharge Summary  Patient ID: Carolyn Hays MRN: [id number] DOB/AGE: [birth year] 53 y.o.  Admit date: 05/16/2024 Discharge date: 05/18/2024  Admission Diagnoses: Principal Problem:   CSF leak Active Problems:   Spinal stenosis in cervical region   Cervical radiculopathy  Discharge Diagnoses:  Principal Problem:   CSF leak Active Problems:   Spinal stenosis in cervical region   Cervical radiculopathy   Discharged Condition: good  Hospital Course: Carolyn Hays was admitted to the hospital for cervical disc arthroplasty.  The procedure went well and she tolerated it well, but had a small durotomy requiring placement of lumbar drain.  She was then admitted for lumbar drainage for 2 days.  On postoperative day 2, the drain was removed and the stitch applied.  Consults: None  Significant Diagnostic Studies: radiology: X-Ray: correct placement of implants  Treatments: surgery: C4-6 arthroplasty  Discharge Exam: Blood pressure 129/73, pulse 77, temperature 98.5 F (36.9 C), temperature source Oral, resp. rate 16, height 5' 6 (1.676 m), weight 103 kg, last menstrual period 09/07/2015, SpO2 95%. General appearance: alert and cooperative CNI Neck soft, trachea midline MAEW  Disposition: Discharge disposition: 01-Home or Self Care       Discharge Instructions     Discharge patient   Complete by: As directed    Discharge disposition: 01-Home or Self Care   Discharge patient date: 05/18/2024   Incentive spirometry RT   Complete by: As directed       Allergies as of 05/18/2024       Reactions   Indomethacin Other (See Comments)   severe H/As   Aleve [naproxen] Nausea Only        Medication List     TAKE these medications    butalbital -acetaminophen-caffeine  50-325-40 MG tablet Commonly known as: FIORICET Take 1 tablet by mouth every 4 (four) hours as needed for headache or migraine.   cetirizine 10 MG tablet Commonly known as: ZYRTEC Take 10 mg  by mouth at bedtime.   DULoxetine HCl 40 MG Cpep Take 40 mg by mouth daily.   Fish Oil 1000 MG Cpdr Take 2,000 mg by mouth in the morning and at bedtime.   gabapentin 100 MG capsule Commonly known as: NEURONTIN Take 100 mg by mouth at bedtime as needed (nerve pain).   Magnesium 400 MG Tabs Take 400 mg by mouth daily.   methocarbamol 500 MG tablet Commonly known as: ROBAXIN Take 1 tablet (500 mg total) by mouth every 6 (six) hours as needed for muscle spasms.   minoxidil 2 % external solution Commonly known as: ROGAINE Apply 1 Application topically daily.   omeprazole 20 MG capsule Commonly known as: PRILOSEC Take 20 mg by mouth daily.   oxyCODONE 5 MG immediate release tablet Commonly known as: Oxy IR/ROXICODONE Take 1 tablet (5 mg total) by mouth every 4 (four) hours as needed for moderate pain (pain score 4-6).   QC TUMERIC COMPLEX PO Take 1 capsule by mouth in the morning and at bedtime.   rosuvastatin  10 MG tablet Commonly known as: CRESTOR  Take 1 tablet (10 mg total) by mouth daily. What changed: when to take this   senna 8.6 MG Tabs tablet Commonly known as: SENOKOT Take 1 tablet (8.6 mg total) by mouth 2 (two) times daily.   Vitamin D3 50 MCG (2000 UT) capsule Take 2,000 Units by mouth daily.   Xeljanz 5 MG Tabs Generic drug: Tofacitinib Citrate Take 5 mg by mouth 2 (two) times daily.  Follow-up Information     Carolyn Hastings, PA-C Follow up on 05/31/2024.   Specialty: Neurosurgery Why: 3 pm Contact information: [address] [place] [address] [phone]                 Signed: REEVES DAISY 05/18/2024, 9:14 AM

## 2024-05-18 NOTE — TOC CM/SW Note (Signed)
 Transition of Care [facility]) - Inpatient Brief Assessment   Patient Details  Name: SHATISHA FALTER MRN: [id number] Date of Birth: 1971-09-08  [facility]) CM/SW Contact:    Krissia Schreier A Tiago Humphrey, RN Phone Number: 05/18/2024, 10:19 AM   Clinical Narrative: Chart reviewed.  Meet with patient at bedside. Prior to admission patient lived at home with her husband.  Patient was independent of ADL's prior to admission.  Patient reports that medications are affordable.  She reports that her husband will assist her on discharge.    I have informed her that PT has recommended no PT follow or DME on discharge.    Patient voices no discharge needs or concerns.     Transition of Care Asessment: Insurance and Status: Insurance coverage has been reviewed Patient has primary care physician: Yes Home environment has been reviewed: Lives at home with husband Prior level of function:: Independent Prior/Current Home Services: No current home services Social Drivers of Health Review: SDOH reviewed no interventions necessary Readmission risk has been reviewed: Yes Transition of care needs: no transition of care needs at this time

## 2024-05-18 NOTE — Progress Notes (Signed)
   Neurosurgery Progress Note  History: Carolyn Hays is s/p C4-6 arthroplasty and lumbar drain placement  POD2: Doing well POD1: Continued nausea when up overnight. Some posterior neck pain.   Physical Exam: Vitals:   05/18/24 0700 05/18/24 0800  BP: (!) 181/105 (!) 143/78  Pulse: 95 86  Resp: 18 16  Temp:  98.5 F (36.9 C)  SpO2: 97% 97%    AA Ox3 CNI  Strength: MAEW    Incision: c/d/I with dermabond in place. No significant fluid collection  Lumbar drain without complications. Removed on rounds  Data:  Other tests/results: see results review  Assessment/Plan:  Carolyn Hays is a 53 y.o presenting with severe cervical stenosis s/p C4-6 arthroplasty complicated by CSF leak requiring lumbar drain   - mobilize - pain control - DVT prophylaxis   Reeves Daisy Department of Neurosurgery

## 2024-05-23 ENCOUNTER — Ambulatory Visit: Admitting: Family Medicine

## 2024-05-27 NOTE — Progress Notes (Unsigned)
   REFERRING PHYSICIAN:  Sharma Coyer, Md [address] [place],  [place] [address]  DOS: 05/16/24  C4-C6 arthroplasty with lumbar drain placement  HISTORY OF PRESENT ILLNESS: Carolyn Hays is 2 weeks status post above surgery. Given fioricet , robaxin , and oxycodone  on discharge from the hospital.   She had small durotomy that required placement of lumbar drain. This was removed on POD#2.   Preop neck and left > right arm pain *** Any headaches?***   PHYSICAL EXAMINATION:  NEUROLOGICAL:  General: In no acute distress.   Awake, alert, oriented to person, place, and time.  Pupils equal round and reactive to light.  Facial tone is symmetric.    Strength: Side Biceps Triceps Deltoid Interossei Grip Wrist Ext. Wrist Flex.  R 5 5 5 5 5 5 5   L 5 5 5 5 5 5 5    Incision c/d/i  Imaging:  Nothing new to review.   Assessment / Plan: Carolyn Hays is doing well s/p above surgery. Treatment options reviewed with patient and following plan made:   - We discussed activity escalation and I have advised the patient to lift up to 10 pounds until 6 weeks after surgery (until follow up with Dr. Clois).   - Reviewed wound care.  - Continue current medications including *** - Follow up as scheduled in 4 weeks and prn.   Advised to contact the office if any questions or concerns arise.   Glade Boys PA-C Dept of Neurosurgery

## 2024-05-31 ENCOUNTER — Ambulatory Visit (INDEPENDENT_AMBULATORY_CARE_PROVIDER_SITE_OTHER): Admitting: Orthopedic Surgery

## 2024-05-31 ENCOUNTER — Encounter: Payer: Self-pay | Admitting: Orthopedic Surgery

## 2024-05-31 VITALS — BP 136/86 | Temp 98.2°F | Ht 66.0 in | Wt 227.0 lb

## 2024-05-31 DIAGNOSIS — M5412 Radiculopathy, cervical region: Secondary | ICD-10-CM

## 2024-05-31 DIAGNOSIS — M4802 Spinal stenosis, cervical region: Secondary | ICD-10-CM

## 2024-05-31 DIAGNOSIS — Z09 Encounter for follow-up examination after completed treatment for conditions other than malignant neoplasm: Secondary | ICD-10-CM

## 2024-05-31 DIAGNOSIS — Z9889 Other specified postprocedural states: Secondary | ICD-10-CM

## 2024-06-01 ENCOUNTER — Other Ambulatory Visit

## 2024-06-13 ENCOUNTER — Ambulatory Visit: Admitting: Orthopedic Surgery

## 2024-06-28 ENCOUNTER — Other Ambulatory Visit: Payer: Self-pay

## 2024-06-28 DIAGNOSIS — M5412 Radiculopathy, cervical region: Secondary | ICD-10-CM

## 2024-06-28 DIAGNOSIS — M4802 Spinal stenosis, cervical region: Secondary | ICD-10-CM

## 2024-06-29 ENCOUNTER — Ambulatory Visit
Admission: RE | Admit: 2024-06-29 | Discharge: 2024-06-29 | Disposition: A | Discharge status: Home / Self Care | Source: Ambulatory Visit | Attending: Neurosurgery | Admitting: Neurosurgery

## 2024-06-29 ENCOUNTER — Ambulatory Visit
Admission: RE | Admit: 2024-06-29 | Discharge: 2024-06-29 | Disposition: A | Discharge status: Home / Self Care | Attending: Neurosurgery | Admitting: Neurosurgery

## 2024-06-29 ENCOUNTER — Encounter: Admitting: Orthopedic Surgery

## 2024-06-29 DIAGNOSIS — M4802 Spinal stenosis, cervical region: Secondary | ICD-10-CM | POA: Insufficient documentation

## 2024-06-29 DIAGNOSIS — M5412 Radiculopathy, cervical region: Secondary | ICD-10-CM | POA: Diagnosis present

## 2024-06-30 ENCOUNTER — Ambulatory Visit (INDEPENDENT_AMBULATORY_CARE_PROVIDER_SITE_OTHER): Admitting: Neurosurgery

## 2024-06-30 ENCOUNTER — Encounter: Payer: Self-pay | Admitting: Neurosurgery

## 2024-06-30 VITALS — BP 122/80 | Ht 66.0 in | Wt 221.8 lb

## 2024-06-30 DIAGNOSIS — M4802 Spinal stenosis, cervical region: Secondary | ICD-10-CM

## 2024-06-30 DIAGNOSIS — Z09 Encounter for follow-up examination after completed treatment for conditions other than malignant neoplasm: Secondary | ICD-10-CM

## 2024-06-30 DIAGNOSIS — Z9889 Other specified postprocedural states: Secondary | ICD-10-CM

## 2024-06-30 DIAGNOSIS — M5020 Other cervical disc displacement, unspecified cervical region: Secondary | ICD-10-CM

## 2024-06-30 DIAGNOSIS — M5412 Radiculopathy, cervical region: Secondary | ICD-10-CM

## 2024-06-30 NOTE — Progress Notes (Signed)
   REFERRING PHYSICIAN:  Sharma Coyer, Md [address] [place],  [place] [address]  DOS: 05/16/24  C4-C6 arthroplasty with lumbar drain placement  HISTORY OF PRESENT ILLNESS: LALA BEEN is status post above surgery.   She is doing very well.  Her arm pain and tingling is much better.  She has had a headache for a few days, but seems like it is more consistent with a migraine.    PHYSICAL EXAMINATION:  NEUROLOGICAL:  General: In no acute distress.   Awake, alert, oriented to person, place, and time.  Pupils equal round and reactive to light.  Facial tone is symmetric.    Strength: Side Biceps Triceps Deltoid Interossei Grip Wrist Ext. Wrist Flex.  R 5 5 5 5 5 5 5   L 5 5 5 5 5 5 5    Incisions c/d/i  Imaging:  No complications noted  Assessment / Plan: ANUHEA GASSNER is doing well s/p above surgery.   We reviewed her activity limitations.  I have given her exercises for her neck.  I have cleared her to proceed with micro needling.  She can do that at any time.    Will see her back in 6 weeks.  Reeves Daisy MD Dept of Neurosurgery

## 2024-07-04 ENCOUNTER — Ambulatory Visit (INDEPENDENT_AMBULATORY_CARE_PROVIDER_SITE_OTHER): Admitting: Family Medicine

## 2024-07-04 ENCOUNTER — Encounter: Payer: Self-pay | Admitting: Family Medicine

## 2024-07-04 VITALS — BP 118/69 | HR 62 | Resp 16 | Ht 60.0 in | Wt 221.0 lb

## 2024-07-04 DIAGNOSIS — L680 Hirsutism: Secondary | ICD-10-CM

## 2024-07-04 DIAGNOSIS — G43509 Persistent migraine aura without cerebral infarction, not intractable, without status migrainosus: Secondary | ICD-10-CM

## 2024-07-04 MED ORDER — SUMATRIPTAN SUCCINATE 25 MG PO TABS: 25.0000 mg | ORAL_TABLET | ORAL | 0 refills | Status: AC | PRN

## 2024-07-04 MED ORDER — SPIRONOLACTONE 25 MG PO TABS: 12.5000 mg | ORAL_TABLET | Freq: Every day | ORAL | 2 refills | Status: DC

## 2024-07-04 NOTE — Progress Notes (Signed)
 Established patient visit   Patient: Carolyn Hays   DOB: 17-Mar-1971   53 y.o. Female  MRN: [id number] Visit Date: 07/04/2024  Today's healthcare provider: Rockie Agent, MD   Chief Complaint  Patient presents with   Hirsutism    Hirsutism. Pt states she had a headache that lasted for 8 days    Subjective     HPI     Hirsutism    Additional comments: Hirsutism. Pt states she had a headache that lasted for 8 days       Last edited by Marylen Odella CROME, CMA on 07/04/2024  2:26 PM.       Discussed the use of AI scribe software for clinical note transcription with the patient, who gave verbal consent to proceed.  History of Present Illness Carolyn Hays is a 53 year old female who presents for follow-up of hirsutism.  She has been managing hirsutism with minoxidil 2% external solution for hair loss and electrolysis for nearly five years. Despite these treatments, she continues to experience excessive hair growth, particularly on her chin and neck, with new hair growth appearing in different areas.  Recently, she experienced a headache lasting eight days, described as a migraine. The headache began on a Friday afternoon, coinciding with a change in weather, and was accompanied by visual disturbances such as seeing round circles, intensified smells causing nausea, and persistent pain. She used butalbital , Excedrin Migraine, and Tylenol  for relief, which helped but did not completely alleviate the headache. The headache gradually resolved on its own.  Her past medical history includes cervical radiculopathy, sleep apnea, and cervicogenic headaches. She is currently taking Xeljanz and oxycodone  for chronic pain management. She has a history of neck surgery for which she had a spinal fluid leak, and she has been undergoing physical therapy, including dry needling, which she finds beneficial.  She mentions experiencing additional stress due to family issues, which she  believes may have contributed to the severity and duration of her recent headache.     Past Medical History:  Diagnosis Date   Allergic rhinitis    Alopecia    Anxiety    Arthritis    Calculus of kidney    Carpal tunnel syndrome    Depression    Dyspareunia, female    Eczema    Family history of breast cancer    Neg BRCA/BART 2014; Neg MyRisk update 10/18; riskscore=19.2%/IBIS=19.1%   Family history of ovarian cancer    paternal aunt in her 65s   GERD (gastroesophageal reflux disease)    History of kidney stones    IBS (irritable bowel syndrome)    Sleep apnea    Vitamin B12 deficiency neuropathy (HCC)    Vitamin D  deficiency     Medications: Outpatient Medications Prior to Visit  Medication Sig   butalbital -acetaminophen -caffeine  (FIORICET ) 50-325-40 MG tablet Take 1 tablet by mouth every 4 (four) hours as needed for headache or migraine.   cetirizine (ZYRTEC) 10 MG tablet Take 10 mg by mouth at bedtime.   Cholecalciferol (VITAMIN D3) 2000 UNITS capsule Take 2,000 Units by mouth daily.   DULoxetine  HCl 40 MG CPEP Take 40 mg by mouth daily.   gabapentin  (NEURONTIN ) 100 MG capsule Take 100 mg by mouth at bedtime as needed (nerve pain).   Magnesium  400 MG TABS Take 400 mg by mouth daily.   methocarbamol  (ROBAXIN ) 500 MG tablet Take 1 tablet (500 mg total) by mouth every 6 (six) hours as needed for  muscle spasms.   minoxidil (ROGAINE) 2 % external solution Apply 1 Application topically daily.   Omega-3 Fatty Acids (FISH OIL) 1000 MG CPDR Take 2,000 mg by mouth in the morning and at bedtime.   omeprazole (PRILOSEC) 20 MG capsule Take 20 mg by mouth daily.   oxyCODONE  (OXY IR/ROXICODONE ) 5 MG immediate release tablet Take 1 tablet (5 mg total) by mouth every 4 (four) hours as needed for moderate pain (pain score 4-6).   rosuvastatin  (CRESTOR ) 10 MG tablet Take 1 tablet (10 mg total) by mouth daily.   Tofacitinib Citrate (XELJANZ) 5 MG TABS Take 5 mg by mouth 2 (two) times daily.    No facility-administered medications prior to visit.    Review of Systems  Last CBC Lab Results  Component Value Date   WBC 4.4 04/11/2024   HGB 11.6 04/11/2024   HCT 35.4 04/11/2024   MCV 92 04/11/2024   MCH 30.2 04/11/2024   RDW 13.1 04/11/2024   PLT 315 04/11/2024   Last metabolic panel Lab Results  Component Value Date   GLUCOSE 97 04/11/2024   NA 140 04/11/2024   K 4.3 04/11/2024   CL 104 04/11/2024   CO2 21 04/11/2024   BUN 14 04/11/2024   CREATININE 0.82 04/11/2024   EGFR 84 04/11/2024   CALCIUM  9.5 04/11/2024   PROT 7.1 04/11/2024   ALBUMIN 4.4 04/11/2024   LABGLOB 2.7 04/11/2024   AGRATIO 1.5 10/13/2022   BILITOT 0.6 04/11/2024   ALKPHOS 68 04/11/2024   AST 24 04/11/2024   ALT 21 04/11/2024   Last lipids Lab Results  Component Value Date   CHOL 148 04/11/2024   HDL 76 04/11/2024   LDLCALC 56 04/11/2024   TRIG 86 04/11/2024   CHOLHDL 3.3 11/30/2023   Last hemoglobin A1c Lab Results  Component Value Date   HGBA1C 5.7 (H) 04/11/2024   Last thyroid functions Lab Results  Component Value Date   TSH 0.946 04/11/2024   Last vitamin D  Lab Results  Component Value Date   VD25OH 48.0 11/30/2023   Last vitamin B12 and Folate Lab Results  Component Value Date   VITAMINB12 527 11/30/2023   Lab Results  Component Value Date   TESTOSTERONE  15 04/11/2024       Objective    BP 118/69 (BP Location: Right Arm, Patient Position: Sitting, Cuff Size: Normal)   Pulse 62   Resp 16   Ht 5' (1.524 m)   Wt 221 lb (100.2 kg)   LMP 09/07/2015   SpO2 98%   BMI 43.16 kg/m   BP Readings from Last 3 Encounters:  07/05/24 118/69  06/30/24 122/80  05/31/24 136/86   Wt Readings from Last 3 Encounters:  07/05/24 221 lb (100.2 kg)  06/30/24 221 lb 12.8 oz (100.6 kg)  05/31/24 227 lb (103 kg)      Physical Exam VITALS: BP- 118/69    Physical Exam  General: Alert, no acute distress Cardio: RRR, no r/m/g Pulm: CTAB, normal work of  breathing Skin: s/p treatments for excess hair, visible areas of treatment on patient's chin area     No results found for any visits on 07/04/24.  Assessment & Plan     Problem List Items Addressed This Visit   None Visit Diagnoses       Hirsutism    -  Primary   Relevant Medications   spironolactone  (ALDACTONE ) 25 MG tablet     Persistent migraine aura without cerebral infarction and without status migrainosus, not intractable  Relevant Medications   SUMAtriptan  (IMITREX ) 25 MG tablet   spironolactone  (ALDACTONE ) 25 MG tablet       Assessment & Plan Hirsutism Chronic Hirsutism with hair growth primarily on the chin and neck. Testosterone  levels are within normal range. - Prescribe spironolactone  12.5 mg once daily to manage symptoms.  - f/u for medication efficacy in 6 weeks   Migraine Chronic  Recent migraine episode lasting over a week, characterized by visual disturbances, intensified smells, and nausea. Likely triggered by stress and weather changes. Previous treatment with butalbital  and Excedrin provided some relief. - Prescribe Imitrex  25 mg with instructions to take at onset of migraine and a second dose after 2 hours if needed, not exceeding two doses in 24 hours.     Return in about 2 months (around 09/03/2024) for hirsituism .         Rockie Agent, MD  [facility] [phone] (phone) [phone] (fax)  [facility]

## 2024-07-26 ENCOUNTER — Encounter: Admitting: Neurosurgery

## 2024-07-31 ENCOUNTER — Other Ambulatory Visit: Payer: Self-pay | Admitting: Orthopedic Surgery

## 2024-07-31 DIAGNOSIS — Z9889 Other specified postprocedural states: Secondary | ICD-10-CM

## 2024-07-31 DIAGNOSIS — M5020 Other cervical disc displacement, unspecified cervical region: Secondary | ICD-10-CM

## 2024-07-31 NOTE — Progress Notes (Unsigned)
   REFERRING PHYSICIAN:  Sharma Coyer, Md [address] [place],  [place] [address]  DOS: 05/16/24  C4-C6 arthroplasty with lumbar drain placement  HISTORY OF PRESENT ILLNESS:  She was doing well at her last visit.   She has some stiffness in her neck. She was diagnosed with carpal tunnel bilaterally years ago. She has intermittent numbness/tingling at night. No headaches.   PHYSICAL EXAMINATION:  NEUROLOGICAL:  General: In no acute distress.   Awake, alert, oriented to person, place, and time.  Pupils equal round and reactive to light.  Facial tone is symmetric.    Strength: Side Biceps Triceps Deltoid Interossei Grip Wrist Ext. Wrist Flex.  R 5 5 5 5 5 5 5   L 5 5 5 5 5 5 5    Incisions well healed.   Imaging:  Cervical xrays dated 08/03/24:  No complications noted.  Report for above xrays not yet available.    Assessment / Plan: Carolyn Hays is doing well s/p above surgery. Treatment options reviewed with patient and following plan made:   - She can return to activity as tolerated.  - Discussed getting updated EMG/NCS of upper extremities with Dr. Maree. She declines for now. She will continue to wear her carpal tunnel braces at night.  - Follow up with Dr. Clois in 6 months with repeat xrays.   Advised to contact the office if any questions or concerns arise.   Glade Boys PA-C Dept of Neurosurgery

## 2024-08-02 ENCOUNTER — Ambulatory Visit
Admission: RE | Admit: 2024-08-02 | Discharge: 2024-08-02 | Disposition: A | Discharge status: Home / Self Care | Attending: Orthopedic Surgery | Admitting: Orthopedic Surgery

## 2024-08-02 ENCOUNTER — Ambulatory Visit
Admission: RE | Admit: 2024-08-02 | Discharge: 2024-08-02 | Disposition: A | Discharge status: Home / Self Care | Source: Ambulatory Visit | Attending: Orthopedic Surgery | Admitting: Orthopedic Surgery

## 2024-08-02 DIAGNOSIS — M5020 Other cervical disc displacement, unspecified cervical region: Secondary | ICD-10-CM | POA: Diagnosis present

## 2024-08-02 DIAGNOSIS — Z9889 Other specified postprocedural states: Secondary | ICD-10-CM | POA: Insufficient documentation

## 2024-08-03 ENCOUNTER — Ambulatory Visit (INDEPENDENT_AMBULATORY_CARE_PROVIDER_SITE_OTHER): Admitting: Orthopedic Surgery

## 2024-08-03 ENCOUNTER — Encounter: Payer: Self-pay | Admitting: Orthopedic Surgery

## 2024-08-03 VITALS — BP 128/82 | Ht 60.0 in | Wt 221.0 lb

## 2024-08-03 DIAGNOSIS — M4802 Spinal stenosis, cervical region: Secondary | ICD-10-CM

## 2024-08-03 DIAGNOSIS — Z9889 Other specified postprocedural states: Secondary | ICD-10-CM

## 2024-08-03 DIAGNOSIS — Z09 Encounter for follow-up examination after completed treatment for conditions other than malignant neoplasm: Secondary | ICD-10-CM

## 2024-09-01 ENCOUNTER — Encounter: Admitting: Orthopedic Surgery

## 2024-09-14 ENCOUNTER — Encounter: Payer: Self-pay | Admitting: Family Medicine

## 2024-09-14 ENCOUNTER — Ambulatory Visit (INDEPENDENT_AMBULATORY_CARE_PROVIDER_SITE_OTHER): Admitting: Family Medicine

## 2024-09-14 VITALS — BP 137/89 | HR 95 | Temp 99.0°F | Ht 60.0 in | Wt 226.0 lb

## 2024-09-14 DIAGNOSIS — G4486 Cervicogenic headache: Secondary | ICD-10-CM | POA: Diagnosis not present

## 2024-09-14 DIAGNOSIS — L680 Hirsutism: Secondary | ICD-10-CM

## 2024-09-14 MED ORDER — SPIRONOLACTONE 25 MG PO TABS: 25.0000 mg | ORAL_TABLET | Freq: Every day | ORAL | Status: AC

## 2024-09-14 NOTE — Progress Notes (Signed)
 Established patient visit   Patient: Carolyn Hays   DOB: 03-Jun-1971   53 y.o. Female  MRN: [id number] Visit Date: 09/14/2024  Today's healthcare provider: Rockie Agent, MD   Chief Complaint  Patient presents with   Medical Management of Chronic Issues    Patient is present for follow up hirsutism. No acute concerns today.    Hirsutism   Subjective     HPI     Medical Management of Chronic Issues    Additional comments: Patient is present for follow up hirsutism. No acute concerns today.       Last edited by Cherry Chiquita HERO, CMA on 09/14/2024 10:23 AM.       Discussed the use of AI scribe software for clinical note transcription with the patient, who gave verbal consent to proceed.  History of Present Illness Carolyn Hays is a 53 year old female who presents for follow-up on hirsutism and migraine management.  The patient reports that the growth of her facial hair has slowed since her last visit. She is undergoing laser treatments and electrolysis, noting that some hairs are white. She feels the number of hairs remains the same, but they do not grow as fast. She is currently on spironolactone  12.5 mg daily.  She has not experienced any migraines since starting treatment and has not needed to use Imitrex . She attributes her previous headache to a stressful day involving a family outing and a change in weather. She describes the headache as severe and long-lasting but has not had a recurrence.  Her son, who is 76 years old, has been diagnosed with minimal scoliosis, noticed by a friend due to his posture. He is scheduled to see an orthopedist. The scoliosis is not causing him pain.  She mentions her aunt, who is 28 years old and has mental health issues, including hoarding. During a recent outing, her aunt had an episode of incontinence, which was stressful for her.     Past Medical History:  Diagnosis Date   Allergic rhinitis    Allergy     Seasonal   Alopecia    Anxiety    Arthritis    Calculus of kidney    Carpal tunnel syndrome    Depression    Dyspareunia, female    Eczema    Family history of breast cancer    Neg BRCA/BART 2014; Neg MyRisk update 10/18; riskscore=19.2%/IBIS=19.1%   Family history of ovarian cancer    paternal aunt in her 35s   GERD (gastroesophageal reflux disease)    Heart murmur    History of kidney stones    IBS (irritable bowel syndrome)    Sleep apnea    Vitamin B12 deficiency neuropathy    Vitamin D  deficiency     Medications: Outpatient Medications Prior to Visit  Medication Sig   butalbital -acetaminophen -caffeine  (FIORICET ) 50-325-40 MG tablet Take 1 tablet by mouth every 4 (four) hours as needed for headache or migraine.   cetirizine (ZYRTEC) 10 MG tablet Take 10 mg by mouth at bedtime.   Cholecalciferol (VITAMIN D3) 2000 UNITS capsule Take 2,000 Units by mouth daily.   DULoxetine  HCl 40 MG CPEP Take 40 mg by mouth daily.   Magnesium  400 MG TABS Take 400 mg by mouth daily.   Omega-3 Fatty Acids (FISH OIL) 1000 MG CPDR Take 2,000 mg by mouth in the morning and at bedtime.   omeprazole (PRILOSEC) 20 MG capsule Take 20 mg by mouth daily.   rosuvastatin  (  CRESTOR ) 10 MG tablet Take 1 tablet (10 mg total) by mouth daily.   SUMAtriptan  (IMITREX ) 25 MG tablet Take 1 tablet (25 mg total) by mouth every 2 (two) hours as needed for migraine. May repeat in 2 hours if headache persists or recurs.   Tofacitinib Citrate (XELJANZ) 5 MG TABS Take 5 mg by mouth 2 (two) times daily.   [DISCONTINUED] spironolactone  (ALDACTONE ) 25 MG tablet Take 0.5 tablets (12.5 mg total) by mouth daily.   [DISCONTINUED] gabapentin  (NEURONTIN ) 100 MG capsule Take 100 mg by mouth at bedtime as needed (nerve pain).   [DISCONTINUED] methocarbamol  (ROBAXIN ) 500 MG tablet Take 1 tablet (500 mg total) by mouth every 6 (six) hours as needed for muscle spasms.   [DISCONTINUED] minoxidil (ROGAINE) 2 % external solution Apply 1  Application topically daily.   No facility-administered medications prior to visit.    Review of Systems  Last CBC Lab Results  Component Value Date   WBC 4.4 04/11/2024   HGB 11.6 04/11/2024   HCT 35.4 04/11/2024   MCV 92 04/11/2024   MCH 30.2 04/11/2024   RDW 13.1 04/11/2024   PLT 315 04/11/2024   Last metabolic panel Lab Results  Component Value Date   GLUCOSE 97 04/11/2024   NA 140 04/11/2024   K 4.3 04/11/2024   CL 104 04/11/2024   CO2 21 04/11/2024   BUN 14 04/11/2024   CREATININE 0.82 04/11/2024   EGFR 84 04/11/2024   CALCIUM  9.5 04/11/2024   PROT 7.1 04/11/2024   ALBUMIN 4.4 04/11/2024   LABGLOB 2.7 04/11/2024   AGRATIO 1.5 10/13/2022   BILITOT 0.6 04/11/2024   ALKPHOS 68 04/11/2024   AST 24 04/11/2024   ALT 21 04/11/2024   Last lipids Lab Results  Component Value Date   CHOL 148 04/11/2024   HDL 76 04/11/2024   LDLCALC 56 04/11/2024   TRIG 86 04/11/2024   CHOLHDL 3.3 11/30/2023   Last hemoglobin A1c Lab Results  Component Value Date   HGBA1C 5.7 (H) 04/11/2024        Objective    BP 137/89 (BP Location: Right Arm, Patient Position: Sitting, Cuff Size: Normal)   Pulse 95   Temp 99 F (37.2 C) (Oral)   Ht 5' (1.524 m)   Wt 226 lb (102.5 kg)   LMP 09/07/2015   SpO2 100%   BMI 44.14 kg/m   BP Readings from Last 3 Encounters:  09/14/24 137/89  08/03/24 128/82  07/05/24 118/69   Wt Readings from Last 3 Encounters:  09/14/24 226 lb (102.5 kg)  08/03/24 221 lb (100.2 kg)  07/05/24 221 lb (100.2 kg)        Physical Exam Vitals reviewed.  Constitutional:      General: She is not in acute distress.    Appearance: Normal appearance. She is not ill-appearing.  Cardiovascular:     Rate and Rhythm: Normal rate and regular rhythm.  Pulmonary:     Effort: Pulmonary effort is normal. No respiratory distress.     Breath sounds: No wheezing, rhonchi or rales.  Neurological:     Mental Status: She is alert and oriented to person, place,  and time.  Psychiatric:        Mood and Affect: Mood normal.        Behavior: Behavior normal.     Physical Exam      No results found for any visits on 09/14/24.  Assessment & Plan     Problem List Items Addressed This Visit  Cervicogenic headache   Other Visit Diagnoses       Hirsutism    -  Primary   Relevant Medications   spironolactone  (ALDACTONE ) 25 MG tablet       Assessment and Plan Assessment & Plan Hirsutism Chronic Hirsutism is improving with current treatment. Hair growth has slowed, though the number of hairs remains unchanged. - Increase spironolactone  to 25 mg daily if tolerated - Continue laser treatments and electrolysis  Migraine Chronic  No recent migraine episodes. Imitrex  has not been needed since the last visit. The previous migraine was attributed to stress and weather changes. - Use Imitrex  25mg  as needed for migraine episodes  General Health Maintenance Colonoscopy is pending and scheduled for December. Completing this screening is important for healthcare maintenance. - Schedule and complete colonoscopy in December     Return in about 5 months (around 02/12/2025) for Chronic F/U.         Rockie Agent, MD  [facility] [phone] (phone) [phone] (fax)  [facility]

## 2024-09-14 NOTE — Patient Instructions (Signed)
 To keep you healthy, please keep in mind the following health maintenance items that you are due for:   Health Maintenance Due  Topic Date Due   Colonoscopy  Never done     Best Wishes,   Dr. Lang

## 2024-09-15 ENCOUNTER — Other Ambulatory Visit: Payer: Self-pay

## 2024-09-15 ENCOUNTER — Telehealth: Payer: Self-pay

## 2024-09-15 DIAGNOSIS — Z860100 Personal history of colon polyps, unspecified: Secondary | ICD-10-CM

## 2024-09-15 DIAGNOSIS — Z83719 Family history of colon polyps, unspecified: Secondary | ICD-10-CM

## 2024-09-15 DIAGNOSIS — Z800 Family history of malignant neoplasm of digestive organs: Secondary | ICD-10-CM

## 2024-09-15 MED ORDER — NA SULFATE-K SULFATE-MG SULF 17.5-3.13-1.6 GM/177ML PO SOLN: 1.0000 | Freq: Once | ORAL | 0 refills | Status: AC

## 2024-09-15 NOTE — Telephone Encounter (Signed)
 Gastroenterology Pre-Procedure Review  Request Date: 06/02/25 Requesting Physician: Dr. Melany  PATIENT REVIEW QUESTIONS: The patient responded to the following health history questions as indicated:    1. Are you having any GI issues? no 2. Do you have a personal history of Polyps? Yes last colonoscopy performed at [facility] 05/31/2020 based on pathology results recommended repeat in 5 years. 3. Do you have a family history of Colon Cancer or Polyps? yes (father colon cancer) 4. Diabetes Mellitus? no 5. Joint replacements in the past 12 months?no 6. Major health problems in the past 3 months?no 7. Any artificial heart valves, MVP, or defibrillator?no    MEDICATIONS & ALLERGIES:    Patient reports the following regarding taking any anticoagulation/antiplatelet therapy:   Plavix, Coumadin, Eliquis, Xarelto, Lovenox , Pradaxa, Brilinta, or Effient? no Aspirin? no  Patient confirms/reports the following medications:  Current Outpatient Medications  Medication Sig Dispense Refill   butalbital -acetaminophen -caffeine  (FIORICET ) 50-325-40 MG tablet Take 1 tablet by mouth every 4 (four) hours as needed for headache or migraine. 14 tablet 0   cetirizine (ZYRTEC) 10 MG tablet Take 10 mg by mouth at bedtime.     Cholecalciferol (VITAMIN D3) 2000 UNITS capsule Take 2,000 Units by mouth daily.     DULoxetine  HCl 40 MG CPEP Take 40 mg by mouth daily.     Magnesium  400 MG TABS Take 400 mg by mouth daily.     Omega-3 Fatty Acids (FISH OIL) 1000 MG CPDR Take 2,000 mg by mouth in the morning and at bedtime.     omeprazole (PRILOSEC) 20 MG capsule Take 20 mg by mouth daily.     rosuvastatin  (CRESTOR ) 10 MG tablet Take 1 tablet (10 mg total) by mouth daily. 90 tablet 3   spironolactone  (ALDACTONE ) 25 MG tablet Take 1 tablet (25 mg total) by mouth daily.     SUMAtriptan  (IMITREX ) 25 MG tablet Take 1 tablet (25 mg total) by mouth every 2 (two) hours as needed for migraine. May repeat in 2 hours if  headache persists or recurs. 10 tablet 0   Tofacitinib Citrate (XELJANZ) 5 MG TABS Take 5 mg by mouth 2 (two) times daily.     No current facility-administered medications for this visit.    Patient confirms/reports the following allergies:  Allergies  Allergen Reactions   Indomethacin Other (See Comments)    severe H/As    Aleve [Naproxen] Nausea Only    No orders of the defined types were placed in this encounter.   AUTHORIZATION INFORMATION Primary Insurance: 1D#: Group #:  Secondary Insurance: 1D#: Group #:  SCHEDULE INFORMATION: Date:06/02/25 Time: Location: [facility]

## 2024-11-28 DIAGNOSIS — E785 Hyperlipidemia, unspecified: Secondary | ICD-10-CM

## 2025-01-31 ENCOUNTER — Ambulatory Visit: Admitting: Neurosurgery

## 2025-01-31 ENCOUNTER — Ambulatory Visit: Admitting: Family Medicine

## 2025-06-02 ENCOUNTER — Ambulatory Visit: Admit: 2025-06-02 | Admitting: Gastroenterology

## 2025-06-02 SURGERY — COLONOSCOPY
Anesthesia: Choice
